# Patient Record
Sex: Female | Born: 1965 | Race: Black or African American | Hispanic: No | Marital: Single | Smoking: Never smoker
Health system: Southern US, Community
[De-identification: ages and names within clinical notes are randomized; demographics above are authoritative.]

## PROBLEM LIST (undated history)

## (undated) DIAGNOSIS — I10 Essential (primary) hypertension: Secondary | ICD-10-CM

## (undated) DIAGNOSIS — R011 Cardiac murmur, unspecified: Secondary | ICD-10-CM

## (undated) HISTORY — DX: Cardiac murmur, unspecified: R01.1

---

## 2015-12-30 ENCOUNTER — Other Ambulatory Visit (HOSPITAL_COMMUNITY)
Admission: RE | Admit: 2015-12-30 | Discharge: 2015-12-30 | Disposition: A | Discharge status: Home / Self Care | Payer: Managed Care, Other (non HMO) | Source: Ambulatory Visit | Attending: Nurse Practitioner | Admitting: Nurse Practitioner

## 2015-12-30 ENCOUNTER — Other Ambulatory Visit: Payer: Self-pay | Admitting: Nurse Practitioner

## 2015-12-30 DIAGNOSIS — Z1151 Encounter for screening for human papillomavirus (HPV): Secondary | ICD-10-CM | POA: Diagnosis present

## 2015-12-30 DIAGNOSIS — Z01419 Encounter for gynecological examination (general) (routine) without abnormal findings: Secondary | ICD-10-CM | POA: Insufficient documentation

## 2016-01-01 LAB — CYTOLOGY - PAP
Diagnosis: NEGATIVE
HPV: NOT DETECTED

## 2016-01-20 ENCOUNTER — Other Ambulatory Visit: Payer: Self-pay | Admitting: Nurse Practitioner

## 2016-02-19 DIAGNOSIS — N950 Postmenopausal bleeding: Secondary | ICD-10-CM | POA: Diagnosis not present

## 2016-02-25 DIAGNOSIS — N950 Postmenopausal bleeding: Secondary | ICD-10-CM | POA: Diagnosis not present

## 2016-02-25 DIAGNOSIS — N840 Polyp of corpus uteri: Secondary | ICD-10-CM | POA: Diagnosis not present

## 2016-04-08 DIAGNOSIS — E7800 Pure hypercholesterolemia, unspecified: Secondary | ICD-10-CM | POA: Diagnosis not present

## 2016-04-08 DIAGNOSIS — R011 Cardiac murmur, unspecified: Secondary | ICD-10-CM | POA: Diagnosis not present

## 2016-04-08 DIAGNOSIS — N840 Polyp of corpus uteri: Secondary | ICD-10-CM | POA: Diagnosis not present

## 2016-04-08 DIAGNOSIS — Z01818 Encounter for other preprocedural examination: Secondary | ICD-10-CM | POA: Diagnosis not present

## 2016-04-13 ENCOUNTER — Telehealth: Payer: Self-pay | Admitting: Cardiology

## 2016-04-13 NOTE — Telephone Encounter (Signed)
04/13/16 [facility] for upcoming appointment with Dr. SwazilandJordan on 04/19/2016. Records given to [facility]  cbr

## 2016-04-17 NOTE — Progress Notes (Signed)
Cardiology Office Note    Date:  04/19/2016   ID:  Theresa Wright, DOB 09/08/1965, MRN [id number]  PCP:  Levon HedgerEBBIE SCHOENHOFF, MD  Cardiologist:  Madonna Flegal SwazilandJordan, MD    History of Present Illness:  Theresa Wright is a 51 y.o. female seen at the request of Dr. Lynnae PrudeShoenhoff for pre operative cardiac evaluation. She is a 51 yo BF originally from [place] who is scheduled for hysteroscopy for treatment of a uterine polyp. She has a history of heart murmur. She was evaluated in [place] in 2015. Echo at that time showed normal LV function. The LA was mildly enlarged. 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 She denies any other cardiac problems. No SOB, chest pain, palpitations. No edema or orthopnea. 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 Works in [employer]. Denies history of HTN, DM, or HLD.   Past Medical History:  Diagnosis Date  . Heart murmur     No past surgical history on file.  Current Medications: No outpatient prescriptions prior to visit.   No facility-administered medications prior to visit.      Allergies:   Patient has no known allergies.   Social History   Social History  . Marital status: Unknown    Spouse name: N/A  . Number of children: 1  . Years of education: N/A   Occupational History  . Banking    Social History Main Topics  . Smoking status: Never Smoker  . Smokeless tobacco: Never Used  . Alcohol use No  . Drug use: No  . Sexual activity: Not Asked   Other Topics Concern  . None   Social History Narrative  . None     Family History:  The patient's family history includes Hypertension in her mother.   ROS:   Please see the history of present illness.    ROS All other systems reviewed and are negative.   PHYSICAL EXAM:   VS:  BP (!) 173/93   Pulse 68   Ht 5\' 7"  (1.702 m)   Wt 282 lb (127.9 kg)   BMI 44.17 kg/m    GEN: Well nourished, well developed, in no acute distress  HEENT: normal  Neck: no JVD, carotid bruits,  or masses Breasts: large Cardiac: RRR; normal S1, S2. Gr 2/6 harsh systolic murmur LSB louder sitting upright.  Respiratory:  clear to auscultation bilaterally, normal work of breathing GI: soft, nontender, nondistended, + BS MS: no deformity or atrophy  Skin: warm and dry, no rash Neuro:  Alert and Oriented x 3, Strength and sensation are intact Psych: euthymic mood, full affect  Wt Readings from Last 3 Encounters:  04/19/16 282 lb (127.9 kg)      Studies/Labs Reviewed:   EKG:  EKG is not ordered today.  The ekg reviewed 04/08/16 demonstrates NSR, normal Ecg. I have personally reviewed and interpreted this study.   Recent Labs: No results found for requested labs within last 8760 hours.   Lipid Panel No results found for: CHOL, TRIG, HDL, CHOLHDL, VLDL, LDLCALC, LDLDIRECT  Additional studies/ records that were reviewed today include:  Labs dated 12/09/15: cholesterol 168, triglycerides 62, HDL 34, LDL 121. CMET, CBC and TSH all normal.   ASSESSMENT:    1. Heart murmur      PLAN:  In order of problems listed above:  1. Patient has a chronic murmur noted since age 51. Echo in 2015 fairly unremarkable. Suspect murmur more related to AV sclerosis.  Based on exam today I see no cardiac reason to delay planned surgery. Cardiac risk is low. Will follow up as needed.    Medication Adjustments/Labs and Tests Ordered: Current medicines are reviewed at length with the patient today.  Concerns regarding medicines are outlined above.  Medication changes, Labs and Tests ordered today are listed in the Patient Instructions below. Patient Instructions  You are cleared for planned surgery  I will see you as needed      Signed, Jidenna Figgs Sw[place], MD  04/19/2016 3:35 PM    [facility] [address] [place], [place], [address] [phone]

## 2016-04-19 ENCOUNTER — Encounter: Payer: Self-pay | Admitting: Cardiology

## 2016-04-19 ENCOUNTER — Ambulatory Visit (INDEPENDENT_AMBULATORY_CARE_PROVIDER_SITE_OTHER): Payer: BLUE CROSS/BLUE SHIELD | Admitting: Cardiology

## 2016-04-19 VITALS — BP 173/93 | HR 68 | Ht 67.0 in | Wt 282.0 lb

## 2016-04-19 DIAGNOSIS — R011 Cardiac murmur, unspecified: Secondary | ICD-10-CM | POA: Diagnosis not present

## 2016-04-19 NOTE — Patient Instructions (Signed)
You are cleared for planned surgery  I will see you as needed

## 2016-04-23 ENCOUNTER — Telehealth: Payer: Self-pay | Admitting: Cardiovascular Disease

## 2016-04-23 NOTE — Telephone Encounter (Signed)
Faxed to Dr Myna HidalgoJennifer Ozan she states that fax was sent to PCP not her she is doing the surgery-verbalized ok for surgery notified faxed also

## 2016-04-23 NOTE — Telephone Encounter (Signed)
New message   Dr. Myna HidalgoJennifer Wright is calling about pt. Pt was seen on March 5th and surgical clearance was supposed to be faxed over to her. She is calling to have surgical clearance faxed to [phone].

## 2016-05-11 ENCOUNTER — Other Ambulatory Visit: Payer: Self-pay | Admitting: Obstetrics & Gynecology

## 2016-05-11 DIAGNOSIS — N950 Postmenopausal bleeding: Secondary | ICD-10-CM | POA: Diagnosis not present

## 2016-05-11 DIAGNOSIS — N840 Polyp of corpus uteri: Secondary | ICD-10-CM | POA: Diagnosis not present

## 2016-06-02 DIAGNOSIS — N950 Postmenopausal bleeding: Secondary | ICD-10-CM | POA: Diagnosis not present

## 2016-06-09 DIAGNOSIS — E559 Vitamin D deficiency, unspecified: Secondary | ICD-10-CM | POA: Diagnosis not present

## 2016-06-09 DIAGNOSIS — R030 Elevated blood-pressure reading, without diagnosis of hypertension: Secondary | ICD-10-CM | POA: Diagnosis not present

## 2016-06-09 DIAGNOSIS — N950 Postmenopausal bleeding: Secondary | ICD-10-CM | POA: Diagnosis not present

## 2016-08-04 DIAGNOSIS — I10 Essential (primary) hypertension: Secondary | ICD-10-CM | POA: Diagnosis not present

## 2016-09-22 ENCOUNTER — Other Ambulatory Visit: Payer: Self-pay | Admitting: Internal Medicine

## 2016-09-22 DIAGNOSIS — H538 Other visual disturbances: Secondary | ICD-10-CM | POA: Diagnosis not present

## 2016-09-22 DIAGNOSIS — R51 Headache: Secondary | ICD-10-CM | POA: Diagnosis not present

## 2016-09-22 DIAGNOSIS — I10 Essential (primary) hypertension: Secondary | ICD-10-CM | POA: Diagnosis not present

## 2016-09-24 ENCOUNTER — Ambulatory Visit
Admission: RE | Admit: 2016-09-24 | Discharge: 2016-09-24 | Disposition: A | Discharge status: Home / Self Care | Payer: BLUE CROSS/BLUE SHIELD | Source: Ambulatory Visit | Attending: Internal Medicine | Admitting: Internal Medicine

## 2016-09-24 DIAGNOSIS — H538 Other visual disturbances: Secondary | ICD-10-CM

## 2016-09-24 DIAGNOSIS — R51 Headache: Secondary | ICD-10-CM | POA: Diagnosis not present

## 2016-09-24 DIAGNOSIS — I10 Essential (primary) hypertension: Secondary | ICD-10-CM

## 2016-09-24 DIAGNOSIS — R42 Dizziness and giddiness: Secondary | ICD-10-CM | POA: Diagnosis not present

## 2016-10-05 DIAGNOSIS — I10 Essential (primary) hypertension: Secondary | ICD-10-CM | POA: Diagnosis not present

## 2016-10-05 DIAGNOSIS — H538 Other visual disturbances: Secondary | ICD-10-CM | POA: Diagnosis not present

## 2016-11-02 ENCOUNTER — Ambulatory Visit
Admission: RE | Admit: 2016-11-02 | Discharge: 2016-11-02 | Disposition: A | Discharge status: Home / Self Care | Payer: BLUE CROSS/BLUE SHIELD | Source: Ambulatory Visit | Attending: Internal Medicine | Admitting: Internal Medicine

## 2016-11-02 ENCOUNTER — Other Ambulatory Visit: Payer: Self-pay | Admitting: Internal Medicine

## 2016-11-02 DIAGNOSIS — I10 Essential (primary) hypertension: Secondary | ICD-10-CM | POA: Diagnosis not present

## 2016-11-02 DIAGNOSIS — R200 Anesthesia of skin: Secondary | ICD-10-CM | POA: Diagnosis not present

## 2016-11-02 DIAGNOSIS — Z23 Encounter for immunization: Secondary | ICD-10-CM | POA: Diagnosis not present

## 2016-11-02 DIAGNOSIS — M48061 Spinal stenosis, lumbar region without neurogenic claudication: Secondary | ICD-10-CM | POA: Diagnosis not present

## 2016-11-23 DIAGNOSIS — I10 Essential (primary) hypertension: Secondary | ICD-10-CM | POA: Diagnosis not present

## 2016-11-23 DIAGNOSIS — G5712 Meralgia paresthetica, left lower limb: Secondary | ICD-10-CM | POA: Diagnosis not present

## 2017-02-03 DIAGNOSIS — I10 Essential (primary) hypertension: Secondary | ICD-10-CM | POA: Diagnosis not present

## 2017-02-03 DIAGNOSIS — G5712 Meralgia paresthetica, left lower limb: Secondary | ICD-10-CM | POA: Diagnosis not present

## 2017-02-03 DIAGNOSIS — E559 Vitamin D deficiency, unspecified: Secondary | ICD-10-CM | POA: Diagnosis not present

## 2017-02-03 DIAGNOSIS — E7800 Pure hypercholesterolemia, unspecified: Secondary | ICD-10-CM | POA: Diagnosis not present

## 2017-03-21 ENCOUNTER — Telehealth: Payer: Self-pay | Admitting: Cardiology

## 2017-03-21 NOTE — Telephone Encounter (Signed)
New message    Pt c/o of Chest Pain: STAT if CP now or developed within 24 hours  1. Are you having CP right now? NO  2. Are you experiencing any other symptoms (ex. SOB, nausea, vomiting, sweating)? NO  3. How long have you been experiencing CP? 2 WEEKS 4. Is your CP continuous or coming and going? COMING AND GOING  5. Have you taken Nitroglycerin? YES ?

## 2017-03-21 NOTE — Telephone Encounter (Signed)
Returned call to patient of Dr. SwazilandJordan - she is scheduled for MD OV on Mar 24, 2017  Patient reports chest pain in center, top left side. She describes the pain as a piercing feeling. The pain lasts a few seconds. The pain is triggered by stress. Denies any associated symptoms. She does not use NTG (despite what is listed per operator). She takes a medication for HTN but does not recall the name. Asked that she bring a list of her medications to her visit this week. Advised her to seek ED eval for CP lasting >5030minutes or that worsens in severity/frequency. She voiced understanding.

## 2017-03-22 NOTE — Progress Notes (Deleted)
Cardiology Office Note    Date:  03/22/2017   ID:  Theresa Wright, DOB 1966-01-06, MRN [id number]  PCP:  Kendrick Ranch, MD  Cardiologist:  Peter Swaziland, MD    History of Present Illness:  Theresa Wright is a 52 y.o. female seen for evaluation of chest pain. She is a 52 yo BF originally from [place] who was seen previously in March 2018 for evaluation of a murmur.  She has a history of heart murmur. She was evaluated in [place] in 2015. Echo at that time showed normal LV function. The LA was mildly enlarged. 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 She denies any other cardiac problems. No SOB, chest pain, palpitations. No edema or orthopnea. 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 Works in Phot[employer]. Denies history of HTN, DM, or HLD.   Past Medical History:  Diagnosis Date  . Heart murmur     *** The histories are not reviewed yet. Please review them in the "History" navigator section and refresh this SmartLink.  Current Medications: Outpatient Medications Prior to Visit  Medication Sig Dispense Refill  . Cholecalciferol (VITAMIN D PO) Take by mouth.     No facility-administered medications prior to visit.      Allergies:   Patient has no known allergies.   Social History   Socioeconomic History  . Marital status: Unknown    Spouse name: Not on file  . Number of children: 1  . Years of education: Not on file  . Highest education level: Not on file  Social Needs  . Financial resource strain: Not on file  . Food insecurity - worry: Not on file  . Food insecurity - inability: Not on file  . Transportation needs - medical: Not on file  . Transportation needs - non-medical: Not on file  Occupational History  . Occupation: Banking  Tobacco Use  . Smoking status: Never Smoker  . Smokeless tobacco: Never Used  Substance and Sexual Activity  . Alcohol use: No  . Drug use: No  . Sexual activity: Not on file  Other Topics Concern  . Not on  file  Social History Narrative  . Not on file     Family History:  The patient's family history includes Hypertension in her mother.   ROS:   Please see the history of present illness.    ROS All other systems reviewed and are negative.   PHYSICAL EXAM:   VS:  There were no vitals taken for this visit.   GEN: Well nourished, well developed, in no acute distress  HEENT: normal  Neck: no JVD, carotid bruits, or masses Breasts: large Cardiac: RRR; normal S1, S2. Gr 2/6 harsh systolic murmur LSB louder sitting upright.  Respiratory:  clear to auscultation bilaterally, normal work of breathing GI: soft, nontender, nondistended, + BS MS: no deformity or atrophy  Skin: warm and dry, no rash Neuro:  Alert and Oriented x 3, Strength and sensation are intact Psych: euthymic mood, full affect  Wt Readings from Last 3 Encounters:  04/19/16 282 lb (127.9 kg)      Studies/Labs Reviewed:   EKG:  EKG is not ordered today.  The ekg reviewed 04/08/16 demonstrates NSR, normal Ecg. I have personally reviewed and interpreted this study.   Recent Labs: No results found for requested labs within last 8760 hours.   Lipid Panel No results found for: CHOL, TRIG, HDL, CHOLHDL, VLDL, LDLCALC, LDLDIRECT  Additional studies/  records that were reviewed today include:  Labs dated 12/09/15: cholesterol 168, triglycerides 62, HDL 34, LDL 121. CMET, CBC and TSH all normal.   ASSESSMENT:    No diagnosis found.   PLAN:  In order of problems listed above:  1. Patient has a chronic murmur noted since age 89. Echo in 2015 fairly unremarkable. Suspect murmur more related to AV sclerosis. Based on exam today I see no cardiac reason to delay planned surgery. Cardiac risk is low. Will follow up as needed.    Medication Adjustments/Labs and Tests Ordered: Current medicines are reviewed at length with the patient today.  Concerns regarding medicines are outlined above.  Medication changes, Labs and Tests  ordered today are listed in the Patient Instructions below. There are no Patient Instructions on file for this visit.   Signed, Peter SwazilandJordan, MD  03/22/2017 12:52 PM    [facility] [address], [place], [place], [id number] [phone]

## 2017-03-24 ENCOUNTER — Ambulatory Visit: Payer: BLUE CROSS/BLUE SHIELD | Admitting: Cardiology

## 2017-04-02 NOTE — Progress Notes (Signed)
Cardiology Office Note   Date:  04/04/2017   ID:  Theresa BeaverMichelle Gilmore, DOB 04/03/1965, MRN [id number]  PCP:  Kendrick RanchSchoenhoff, Deborah D, MD  Cardiologist: Dr. Allyson SabalBerry Chief Complaint  Patient presents with  . Follow-up  . Shortness of Breath     History of Present Illness:  Theresa Wright is a 52 y.o. female who presents for ongoing assessment and management with history of heart murmur.  Echo at that time showed normal LV function.  The left atrium was mildly enlarged.  She had AV sclerosis with mild MR and TR.  Other history includes severe arthritis in both of her knees.  She was last seen by Dr. Allyson SabalBerry on 04/15/2016. his note states that she has had a chronic heart murmur since age of 52.  We will continue to follow; at that time she was cleared for surgery.  She called our office on 03/21/2017 with complaints of chest discomfort described as a "piercing feeling lasting only a few seconds" pain was triggered by stress.  She was advised to go to ED if pain persisted or worsen otherwise she would follow-up with us in the office.  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 She often feels discomfort in her chest when she is feeling stressed or angry. He is also upset that she has gained 20 pounds since being seen in the past. Our office records only showed a 10 pound weight gain.  She's become quite sedentary, especially over the winter months. [place] when the weather was better.   Past Medical History:  Diagnosis Date  . Heart murmur     History reviewed. No pertinent surgical history.   Current Outpatient Medications  Medication Sig Dispense Refill  . amLODipine (NORVASC) 2.5 MG tablet Take 2.5 mg by mouth daily.  2  . Cholecalciferol (VITAMIN D3) 50000 units TABS Take 50,000 mg by mouth daily.  0   No current facility-administered  medications for this visit.     Allergies:   Patient has no known allergies.    Social History:  The patient  reports that  has never smoked. she has never used smokeless tobacco. She reports that she does not drink alcohol or use drugs.   Family History:  The patient's family history includes Hypertension in her mother.    ROS: All other systems are reviewed and negative. Unless otherwise mentioned in H&P    PHYSICAL EXAM: VS:  BP 132/90   Pulse (!) 57   Ht 5\' 8"  (1.727 m)   Wt 292 lb (132.5 kg)   BMI 44.40 kg/m  , BMI Body mass index is 44.4 kg/m. GEN: Well nourished, well developed, in no acute distress Obese HEENT: normal  Neck: no JVD, carotid bruits, or masses Cardiac: RRR; 1/6 systolic murmur no rubs, or gallops,no edema  Respiratory:  clear to auscultation bilaterally, normal work of breathing GI: soft, nontender, nondistended, + BS MS: no deformity or atrophy  Skin: warm and dry, no rash Neuro:  Strength and sensation are intact Psych: euthymic mood, full affect   EKG: Marland Kitchen. The ekg ordered today demonstrates normal sinus rhythm, rate of 57 bpm. No ST-T wave abnormalities. Poor R-wave transition.   Recent Labs: No results found for requested labs within last 8760 hours.    Lipid Panel No results found  for: CHOL, TRIG, HDL, CHOLHDL, VLDL, LDLCALC, LDLDIRECT    Wt Readings from Last 3 Encounters:  04/04/17 292 lb (132.5 kg)  04/19/16 282 lb (127.9 kg)      Other studies Reviewed: Echocardiogram dated 07/16/2013 (transcribed from scanned document) Conclusion: 1. Overall left ventricular systolic function is normal with an estimated EF of 60% to 65% 2. Normal LV size with upper normal wall thickness 3. Mildly dilated left atrium 4. Normal right atrial and ventricular size 5. Minimal aortic valve sclerosis no significant aortic regurgitation 6. Diastolic filling pattern is normal 7. Borderline increased aortic valve Doppler parameters with 2.2 mm/second  maximum velocity. 19 mmHg/12 mmHg peak/mean pressure gradient and 2.0 cm aortic valve area by continuity equation 8. Minimal mitral annular calcification 9. Mild tricuspid regurg 10. There is no evidence of pulmonary hypertension 11. No pericardial effusion 12. No intracardiac masses thrombi or vegetation. ASSESSMENT AND PLAN:  1.  Noncardiac chest pain: Described as a sharp pain substernal left sternal border beside her breath occurring when she is angry, stressed out, or anxious. It goes away after about 5 minutes when she calms down. It is nonradiating. She states that she has not had any further pain for the last 2 weeks.  2. Self-reported stress: The patient is advised to take a walk each day, deep breathing techniques, and consider changing positions at work he is financially feasible to her job is causing her a great of tension and stress. He is given printed instructions on stress reduction  3. Obesity: Patient self reports a 20 pound weight gain, she has gained 10 pounds since we saw her last. She has stopped exercising. I've advised her to begin walking again especially when the weather gets better. She had been walking at a local park and I've encouraged her to go back to doing that. I've also reminded her that this is a group stress reducer and will help to burn more calories and keep her mindful of her weight and eating patterns.   Current medicines are reviewed at length with the patient today.    Labs/ tests ordered today include: none   Bettey Mare. Liborio Nixon, ANP, AACC   04/04/2017 9:02 AM    [facility] [address], [place], [place] [address] Phone: [phone]; Fax: [phone]

## 2017-04-04 ENCOUNTER — Encounter (INDEPENDENT_AMBULATORY_CARE_PROVIDER_SITE_OTHER): Payer: Self-pay

## 2017-04-04 ENCOUNTER — Ambulatory Visit (INDEPENDENT_AMBULATORY_CARE_PROVIDER_SITE_OTHER): Payer: BLUE CROSS/BLUE SHIELD | Admitting: Adult Health

## 2017-04-04 ENCOUNTER — Encounter: Payer: Self-pay | Admitting: Adult Health

## 2017-04-04 VITALS — BP 132/90 | HR 57 | Ht 68.0 in | Wt 292.0 lb

## 2017-04-04 DIAGNOSIS — R079 Chest pain, unspecified: Secondary | ICD-10-CM

## 2017-04-04 DIAGNOSIS — F411 Generalized anxiety disorder: Secondary | ICD-10-CM | POA: Diagnosis not present

## 2017-04-04 DIAGNOSIS — F430 Acute stress reaction: Secondary | ICD-10-CM | POA: Diagnosis not present

## 2017-04-04 NOTE — Patient Instructions (Signed)
Medication Instructions:  NO CHANGES If you need a refill on your cardiac medications before your next appointment, please call your pharmacy.  Labwork: PLEASE HAVE LABWORK AT PCP AND HAVE THEM FAX [place] RESULTS [phone]  Special Instructions: PLEASE FOLLOW STRESS/EXERCISE INSTRUCTIONS BELOW  Follow-Up: Your physician wants you to follow-up in: 12 MONTHS WITH DR [place] You should receive a reminder letter in the mail two months in advance. If you do not receive a letter, please call our office 01-2018 to schedule the 03-2018 follow-up appointment.   Thank you for choosing [facility] at [facility]!!        Stress and Stress Management Stress is a normal reaction to life events. It is what you feel when life demands more than you are used to or more than you can handle. Some stress can be useful. For example, the stress reaction can help you catch the last bus of the day, study for a test, or meet a deadline at work. But stress that occurs too often or for too long can cause problems. It can affect your emotional health and interfere with relationships and normal daily activities. Too much stress can weaken your immune system and increase your risk for physical illness. If you already have a medical problem, stress can make it worse. What are the causes? All sorts of life events may cause stress. An event that causes stress for one person may not be stressful for another person. Major life events commonly cause stress. These may be positive or negative. Examples include losing your job, moving into a new home, getting married, having a baby, or losing a loved one. Less obvious life events may also cause stress, especially if they occur day after day or in combination. Examples include working long hours, driving in traffic, caring for children, being in debt, or being in a difficult relationship. What are the signs or symptoms? Stress may cause emotional symptoms including, the  following:  Anxiety. This is feeling worried, afraid, on edge, overwhelmed, or out of control.  Anger. This is feeling irritated or impatient.  Depression. This is feeling sad, down, helpless, or guilty.  Difficulty focusing, remembering, or making decisions.  Stress may cause physical symptoms, including the following:  Aches and pains. These may affect your head, neck, back, stomach, or other areas of your body.  Tight muscles or clenched jaw.  Low energy or trouble sleeping.  Stress may cause unhealthy behaviors, including the following:  Eating to feel better (overeating) or skipping meals.  Sleeping too little, too much, or both.  Working too much or putting off tasks (procrastination).  Smoking, drinking alcohol, or using drugs to feel better.  How is this diagnosed? Stress is diagnosed through an assessment by your health care provider. Your health care provider will ask questions about your symptoms and any stressful life events.Your health care provider will also ask about your medical history and may order blood tests or other tests. Certain medical conditions and medicine can cause physical symptoms similar to stress. Mental illness can cause emotional symptoms and unhealthy behaviors similar to stress. Your health care provider may refer you to a mental health professional for further evaluation. How is this treated? Stress management is the recommended treatment for stress.The goals of stress management are reducing stressful life events and coping with stress in healthy ways. Techniques for reducing stressful life events include the following:  Stress identification. Self-monitor for stress and identify what causes stress for you. These skills may help you  to avoid some stressful events.  Time management. Set your priorities, keep a calendar of events, and learn to say "no." These tools can help you avoid making too many commitments.  Techniques for coping with  stress include the following:  Rethinking the problem. Try to think realistically about stressful events rather than ignoring them or overreacting. Try to find the positives in a stressful situation rather than focusing on the negatives.  Exercise. Physical exercise can release both physical and emotional tension. The key is to find a form of exercise you enjoy and do it regularly.  Relaxation techniques. These relax the body and mind. Examples include yoga, meditation, tai chi, biofeedback, deep breathing, progressive muscle relaxation, listening to music, being out in nature, journaling, and other hobbies. Again, the key is to find one or more that you enjoy and can do regularly.  Healthy lifestyle. Eat a balanced diet, get plenty of sleep, and do not smoke. Avoid using alcohol or drugs to relax.  Strong support network. Spend time with family, friends, or other people you enjoy being around.Express your feelings and talk things over with someone you trust.  Counseling or talktherapy with a mental health professional may be helpful if you are having difficulty managing stress on your own. Medicine is typically not recommended for the treatment of stress.Talk to your health care provider if you think you need medicine for symptoms of stress. Follow these instructions at home:  Keep all follow-up visits as directed by your health care provider.  Take all medicines as directed by your health care provider. Contact a health care provider if:  Your symptoms get worse or you start having new symptoms.  You feel overwhelmed by your problems and can no longer manage them on your own. Get help right away if:  You feel like hurting yourself or someone else. This information is not intended to replace advice given to you by your health care provider. Make sure you discuss any questions you have with your health care provider. Document Released: 07/28/2000 Document Revised: 07/10/2015 Document  Reviewed: 09/26/2012 Elsevier Interactive Patient Education  2017 [employer].   Exercising to [employer] Exercising regularly is important. It has many health benefits, such as:  Improving your overall fitness, flexibility, and endurance.  Increasing your bone density.  Helping with weight control.  Decreasing your body fat.  Increasing your muscle strength.  Reducing stress and tension.  Improving your overall health.  In order to become healthy and stay healthy, it is recommended that you do moderate-intensity and vigorous-intensity exercise. You can tell that you are exercising at a moderate intensity if you have a higher heart rate and faster breathing, but you are still able to hold a conversation. You can tell that you are exercising at a vigorous intensity if you are breathing much harder and faster and cannot hold a conversation while exercising. How often should I exercise? Choose an activity that you enjoy and set realistic goals. Your health care provider can help you to make an activity plan that works for you. Exercise regularly as directed by your health care provider. This may include:  Doing resistance training twice each week, such as: ? Push-ups. ? Sit-ups. ? Lifting weights. ? Using resistance bands.  Doing a given intensity of exercise for a given amount of time. Choose from these options: ? 150 minutes of moderate-intensity exercise every week. ? 75 minutes of vigorous-intensity exercise every week. ? A mix of moderate-intensity and vigorous-intensity exercise every week.  Children, pregnant women, people who are out of shape, people who are overweight, and older adults may need to consult a health care provider for individual recommendations. If you have any sort of medical condition, be sure to consult your health care provider before starting a new exercise program. What are some exercise ideas? Some moderate-intensity exercise ideas  include:  Walking at a rate of 1 mile in 15 minutes.  Biking.  Hiking.  Golfing.  Dancing.  Some vigorous-intensity exercise ideas include:  Walking at a rate of at least 4.5 miles per hour.  Jogging or running at a rate of 5 miles per hour.  Biking at a rate of at least 10 miles per hour.  Lap swimming.  Roller-skating or in-line skating.  Cross-country skiing.  Vigorous competitive sports, such as football, basketball, and soccer.  Jumping rope.  Aerobic dancing.  What are some everyday activities that can help me to get exercise?  [place] work, such as: ? Pushing a Conservation officer, nature. ? Raking and bagging leaves.  Washing and waxing your car.  Pushing a stroller.  Shoveling snow.  Gardening.  Washing windows or floors. How can I be more active in my day-to-day activities?  Use the stairs instead of the elevator.  Take a walk during your lunch break.  If you drive, park your car farther away from work or school.  If you take public transportation, get off one stop early and walk the rest of the way.  Make all of your phone calls while standing up and walking around.  Get up, stretch, and walk around every 30 minutes throughout the day. What guidelines should I follow while exercising?  Do not exercise so much that you hurt yourself, feel dizzy, or get very short of breath.  Consult your health care provider before starting a new exercise program.  Wear comfortable clothes and shoes with good support.  Drink plenty of water while you exercise to prevent dehydration or heat stroke. Body water is lost during exercise and must be replaced.  Work out until you breathe faster and your heart beats faster. This information is not intended to replace advice given to you by your health care provider. Make sure you discuss any questions you have with your health care provider. Document Released: 03/06/2010 Document Revised: 07/10/2015 Document Reviewed:  07/05/2013 Elsevier Interactive Patient Education  [employer].

## 2017-06-01 DIAGNOSIS — Z9889 Other specified postprocedural states: Secondary | ICD-10-CM | POA: Diagnosis not present

## 2017-06-01 DIAGNOSIS — Z01411 Encounter for gynecological examination (general) (routine) with abnormal findings: Secondary | ICD-10-CM | POA: Diagnosis not present

## 2017-06-01 DIAGNOSIS — N939 Abnormal uterine and vaginal bleeding, unspecified: Secondary | ICD-10-CM | POA: Diagnosis not present

## 2017-06-22 DIAGNOSIS — N939 Abnormal uterine and vaginal bleeding, unspecified: Secondary | ICD-10-CM | POA: Diagnosis not present

## 2017-06-27 ENCOUNTER — Other Ambulatory Visit: Payer: Self-pay

## 2017-10-05 DIAGNOSIS — Z124 Encounter for screening for malignant neoplasm of cervix: Secondary | ICD-10-CM | POA: Diagnosis not present

## 2017-10-05 DIAGNOSIS — N939 Abnormal uterine and vaginal bleeding, unspecified: Secondary | ICD-10-CM | POA: Diagnosis not present

## 2017-10-05 DIAGNOSIS — N926 Irregular menstruation, unspecified: Secondary | ICD-10-CM | POA: Diagnosis not present

## 2017-10-05 DIAGNOSIS — Z6841 Body Mass Index (BMI) 40.0 and over, adult: Secondary | ICD-10-CM | POA: Diagnosis not present

## 2017-11-02 DIAGNOSIS — L02429 Furuncle of limb, unspecified: Secondary | ICD-10-CM | POA: Diagnosis not present

## 2017-11-02 DIAGNOSIS — Z1231 Encounter for screening mammogram for malignant neoplasm of breast: Secondary | ICD-10-CM | POA: Diagnosis not present

## 2017-11-02 DIAGNOSIS — Z09 Encounter for follow-up examination after completed treatment for conditions other than malignant neoplasm: Secondary | ICD-10-CM | POA: Diagnosis not present

## 2018-04-06 DIAGNOSIS — Z0000 Encounter for general adult medical examination without abnormal findings: Secondary | ICD-10-CM | POA: Diagnosis not present

## 2018-08-07 DIAGNOSIS — N939 Abnormal uterine and vaginal bleeding, unspecified: Secondary | ICD-10-CM | POA: Diagnosis not present

## 2018-08-07 DIAGNOSIS — D250 Submucous leiomyoma of uterus: Secondary | ICD-10-CM | POA: Diagnosis not present

## 2018-08-24 ENCOUNTER — Other Ambulatory Visit: Payer: Self-pay | Admitting: Obstetrics & Gynecology

## 2018-08-24 DIAGNOSIS — N939 Abnormal uterine and vaginal bleeding, unspecified: Secondary | ICD-10-CM | POA: Diagnosis not present

## 2018-08-24 DIAGNOSIS — Z124 Encounter for screening for malignant neoplasm of cervix: Secondary | ICD-10-CM | POA: Diagnosis not present

## 2018-09-08 DIAGNOSIS — G5712 Meralgia paresthetica, left lower limb: Secondary | ICD-10-CM | POA: Diagnosis not present

## 2018-09-08 DIAGNOSIS — I10 Essential (primary) hypertension: Secondary | ICD-10-CM | POA: Diagnosis not present

## 2018-09-08 DIAGNOSIS — E7800 Pure hypercholesterolemia, unspecified: Secondary | ICD-10-CM | POA: Diagnosis not present

## 2019-05-02 IMAGING — CT CT HEAD W/O CM
3 of 4 series · 17 of 47 positions shown, 20 images · non-contrast
Comparison: None.

CLINICAL DATA: Blurry vision, vertigo and headaches.

EXAM:
CT HEAD WITHOUT CONTRAST
TECHNIQUE: Contiguous axial images were obtained from the base of the skull
through the vertex without intravenous contrast.

[Series 32: 3d filtered head w/o · axial · non-contrast · 0.49mm/px · z∈[-2,+118]mm · 11 of 30 slices shown, 14 images]
[im 3/30  brain]
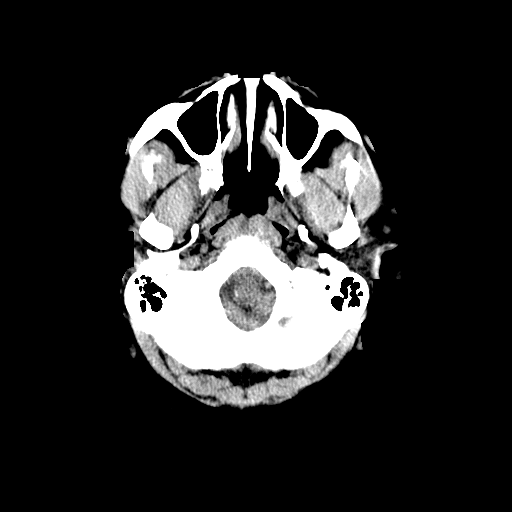
[im 3/30  bone]
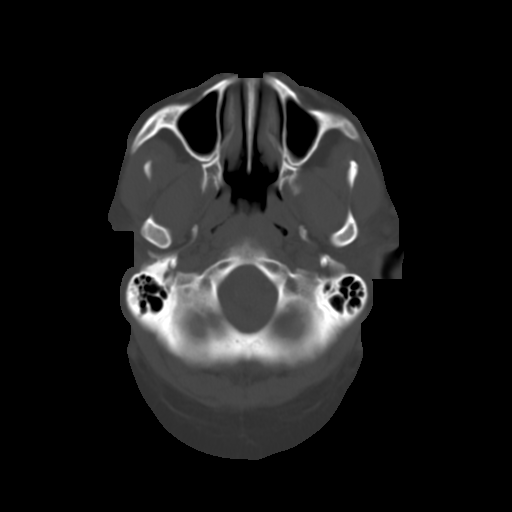
[im 5/30  brain]
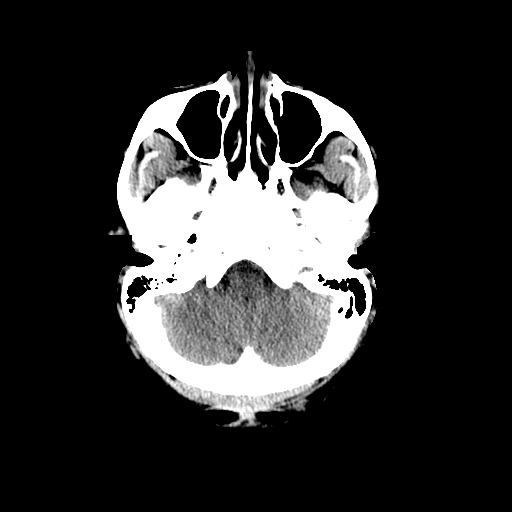
[im 7/30  brain]
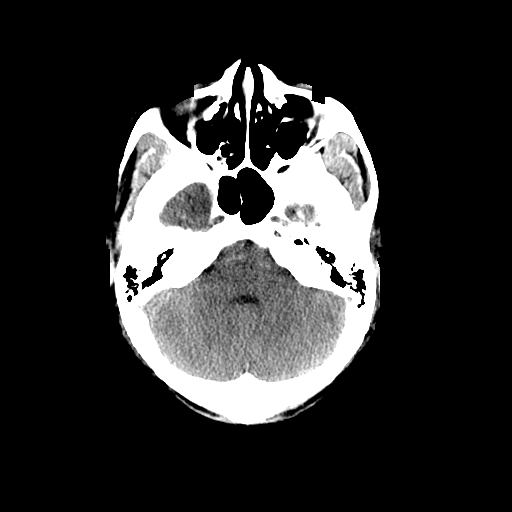
[im 11/30  brain]
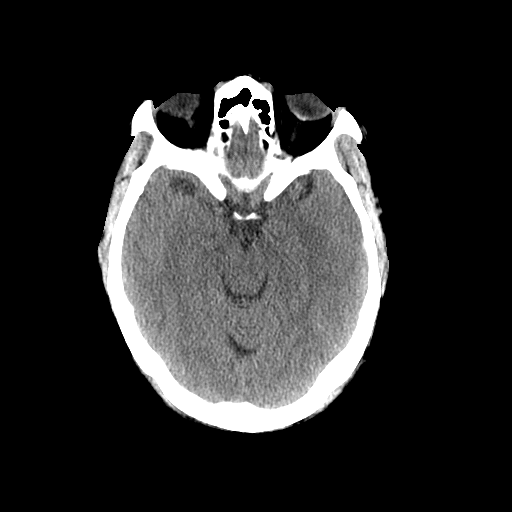
[im 13/30  brain]
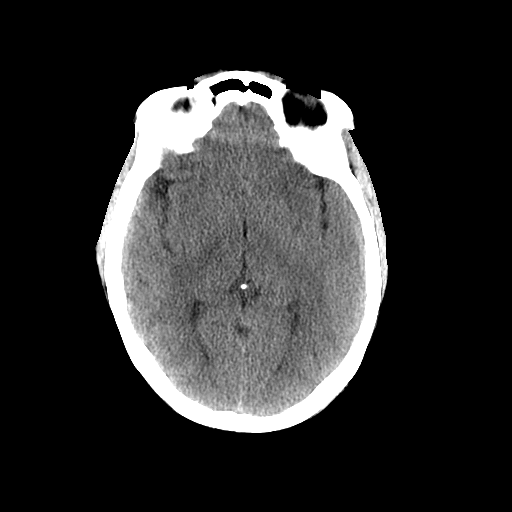
[im 13/30  bone]
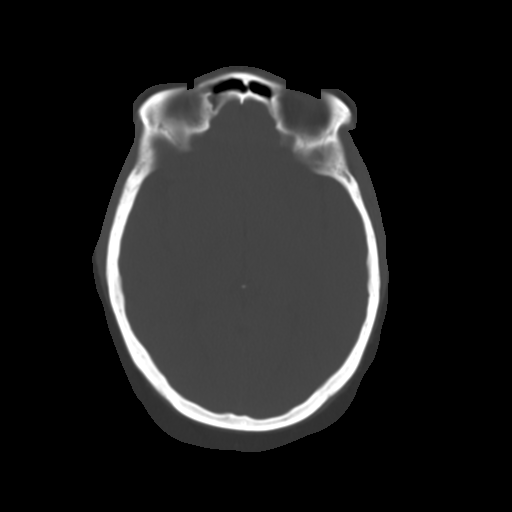
[im 15/30  brain]
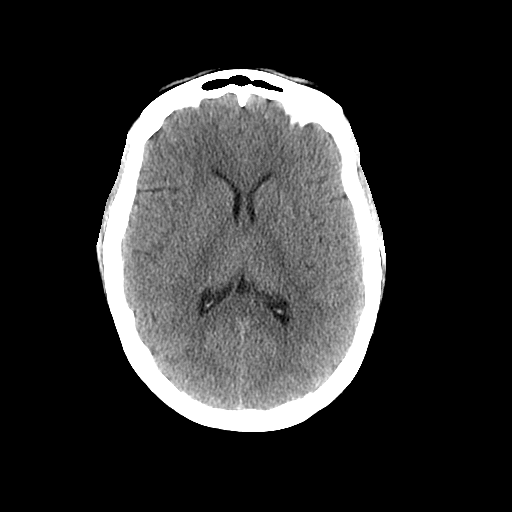
[im 17/30  brain]
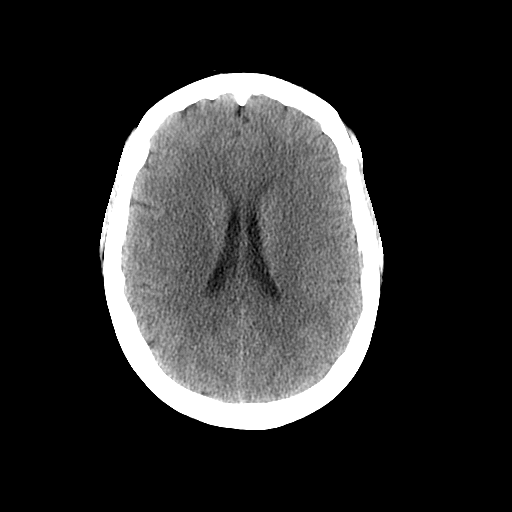
[im 19/30  brain]
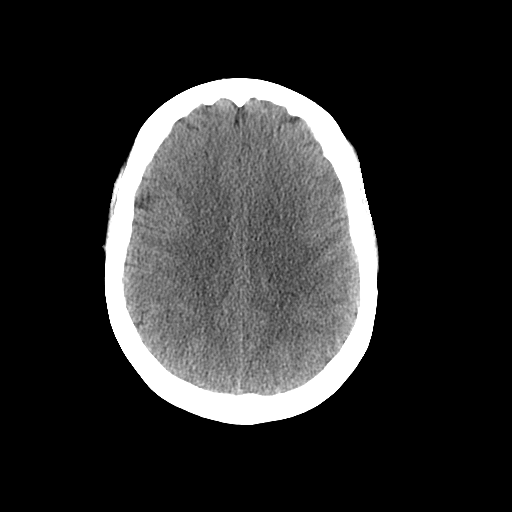
[im 23/30  brain]
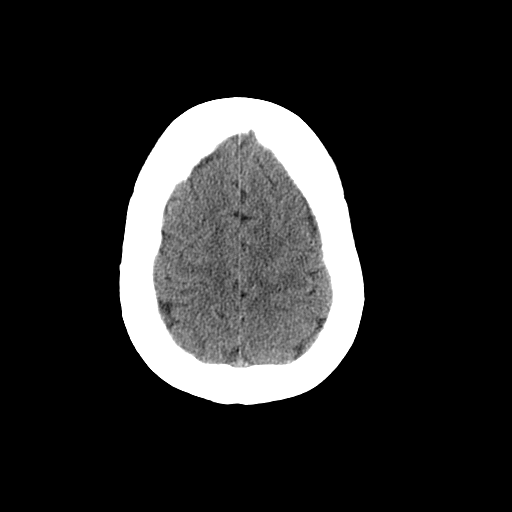
[im 23/30  bone]
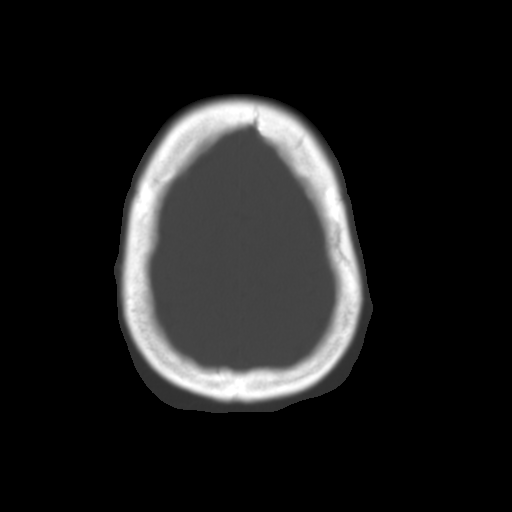
[im 25/30  brain]
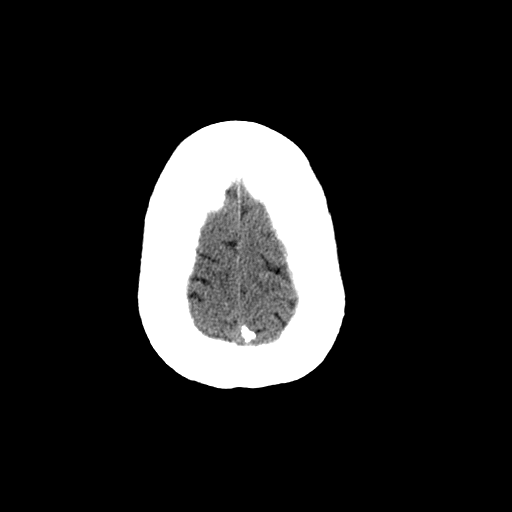
[im 27/30  brain]
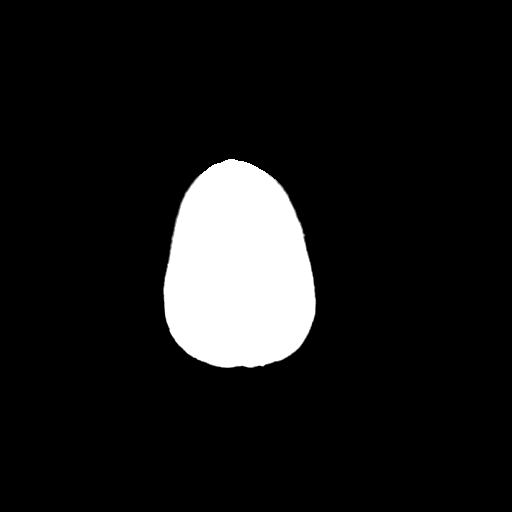

[Series 601: coronal brain · coronal · 0.49mm/px · 3 of 76 slices shown]
[im 26/76  brain]
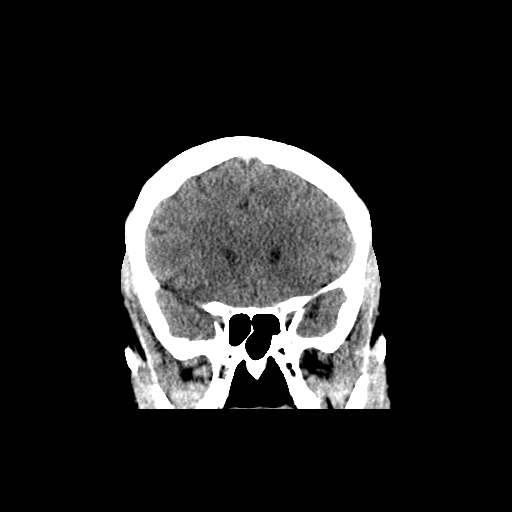
[im 34/76  brain]
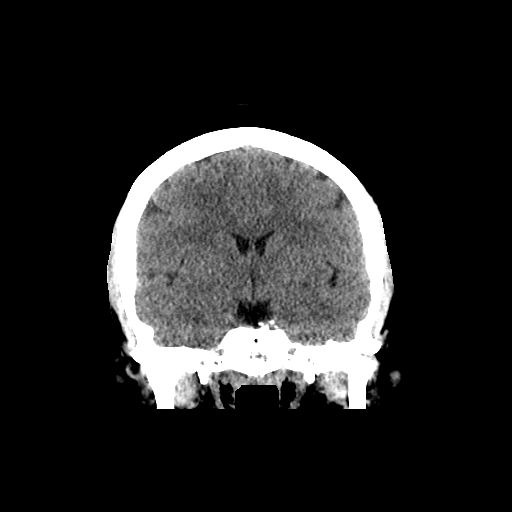
[im 42/76  brain]
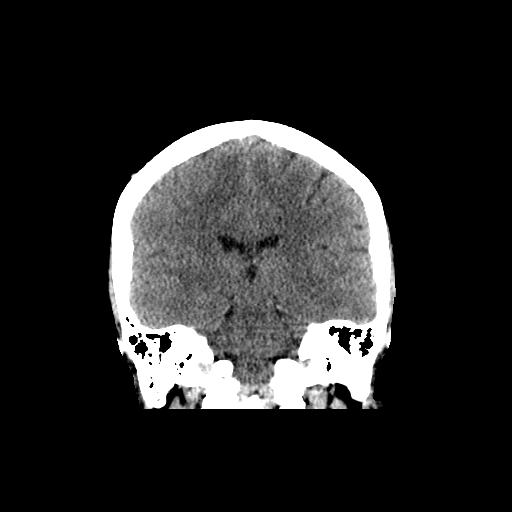

[Series 602: sagittal brain · sagittal · 0.49mm/px · 3 of 60 slices shown]
[im 20/60  brain]
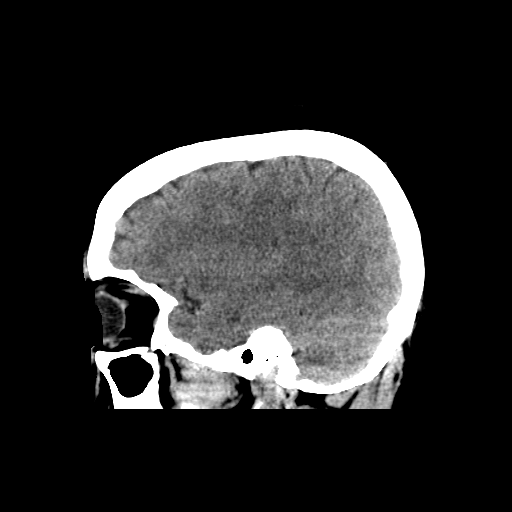
[im 30/60  brain]
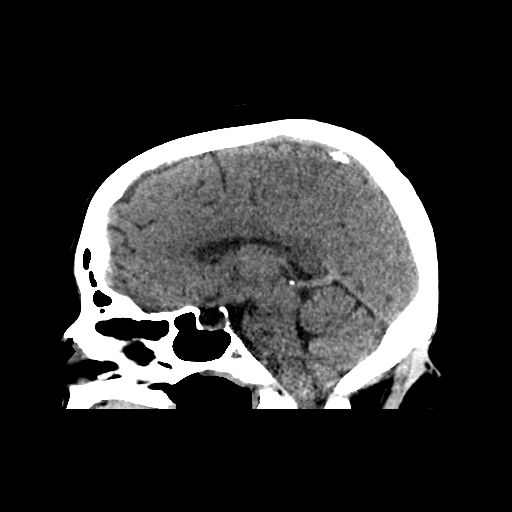
[im 40/60  brain]
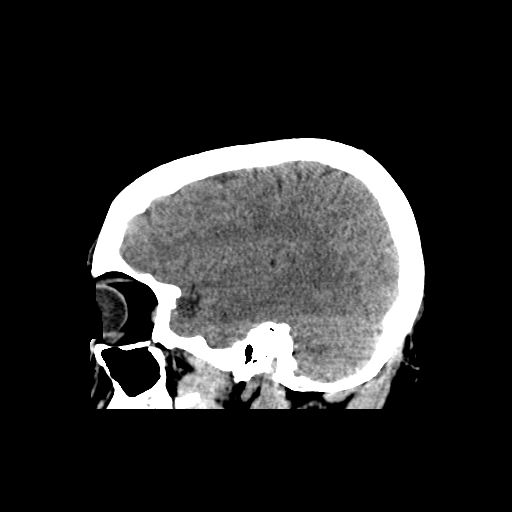

[17 of 47 positions shown; findings below may reference images not displayed]

FINDINGS: Brain: No evidence of acute infarction, hemorrhage, hydrocephalus,
extra-axial collection or mass lesion/mass effect.

Vascular: No hyperdense vessel or unexpected calcification.

Skull: Normal. Negative for fracture or focal lesion.

Sinuses/Orbits: No acute finding.

Other: None.
IMPRESSION: Normal head CT.

## 2019-05-17 DIAGNOSIS — Z713 Dietary counseling and surveillance: Secondary | ICD-10-CM | POA: Diagnosis not present

## 2019-06-10 IMAGING — DX DG LUMBAR SPINE 2-3V
3 series · 3 of 3 positions shown · non-contrast
Comparison: None.

CLINICAL DATA: 51-year-old female with low back pain and left-sided
thigh numbness for 20 years. No injury. Initial encounter.

EXAM:
LUMBAR SPINE - 2-3 VIEW

[dg lumbar spine 2-3 views (1 of 3)]
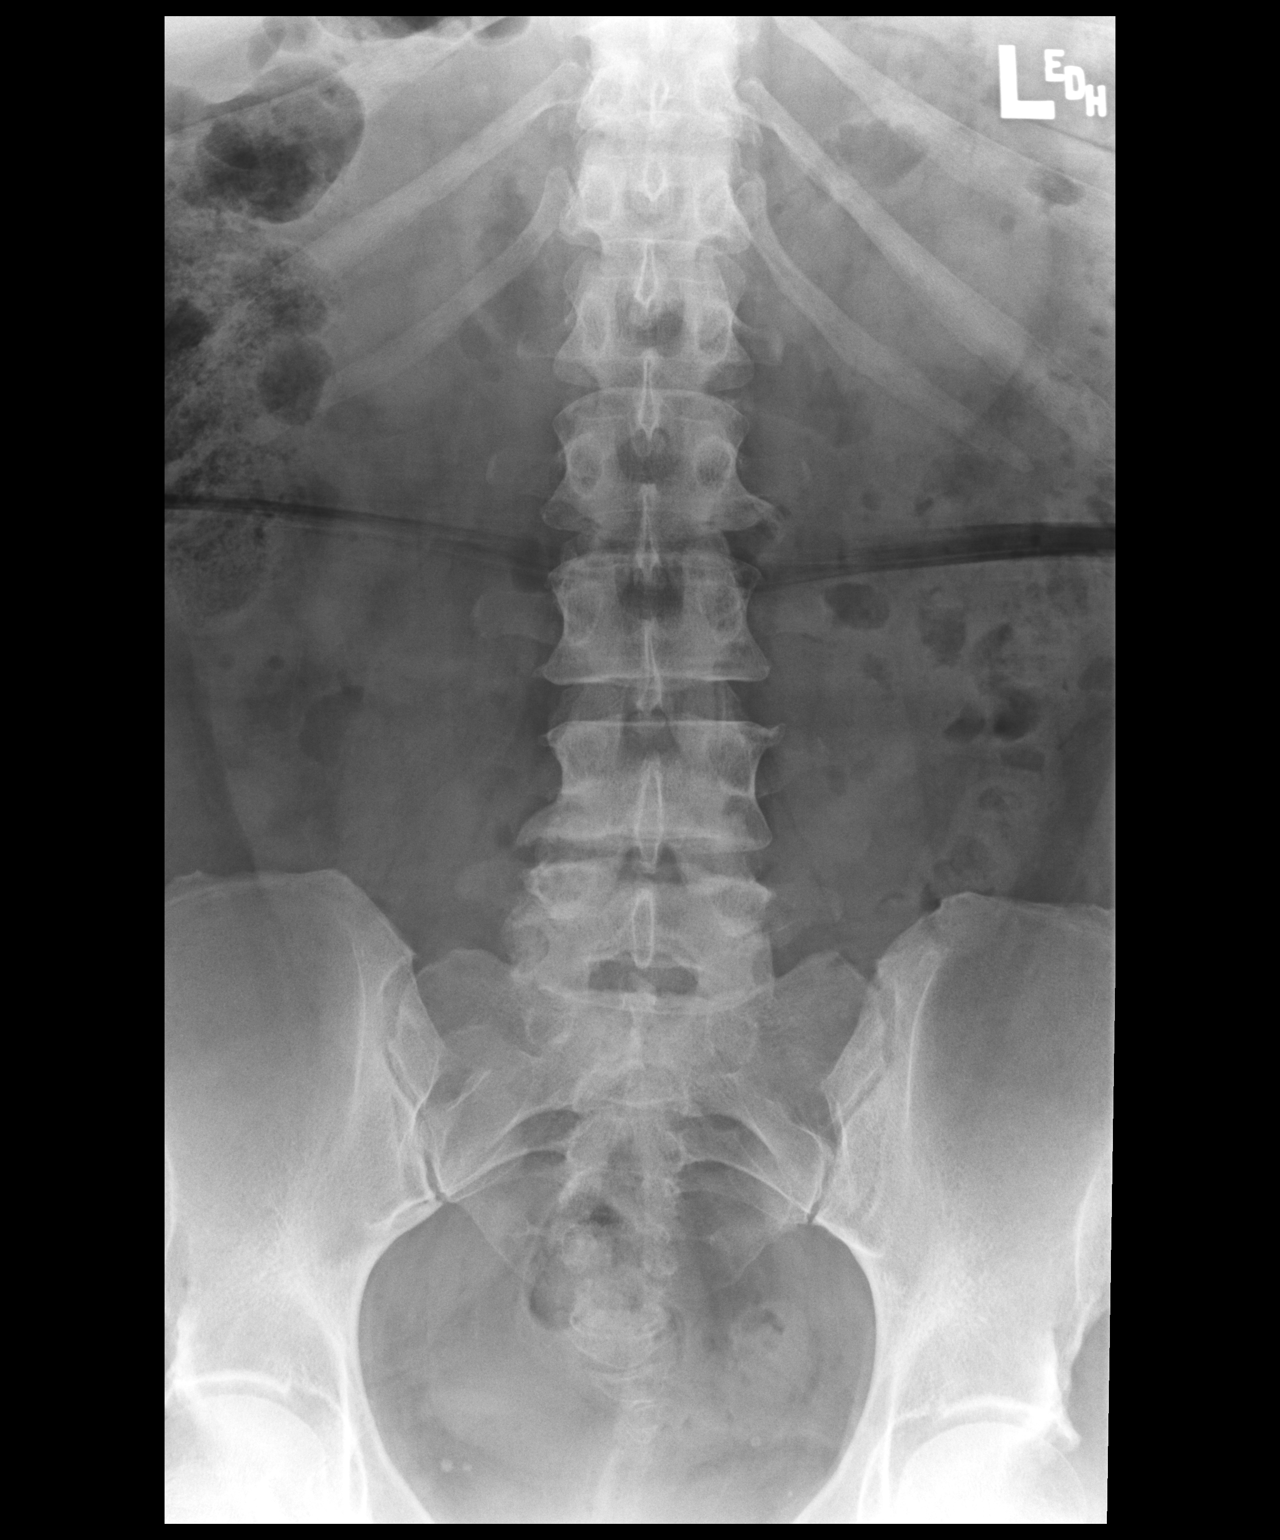

[dg lumbar spine 2-3 views (2 of 3)]
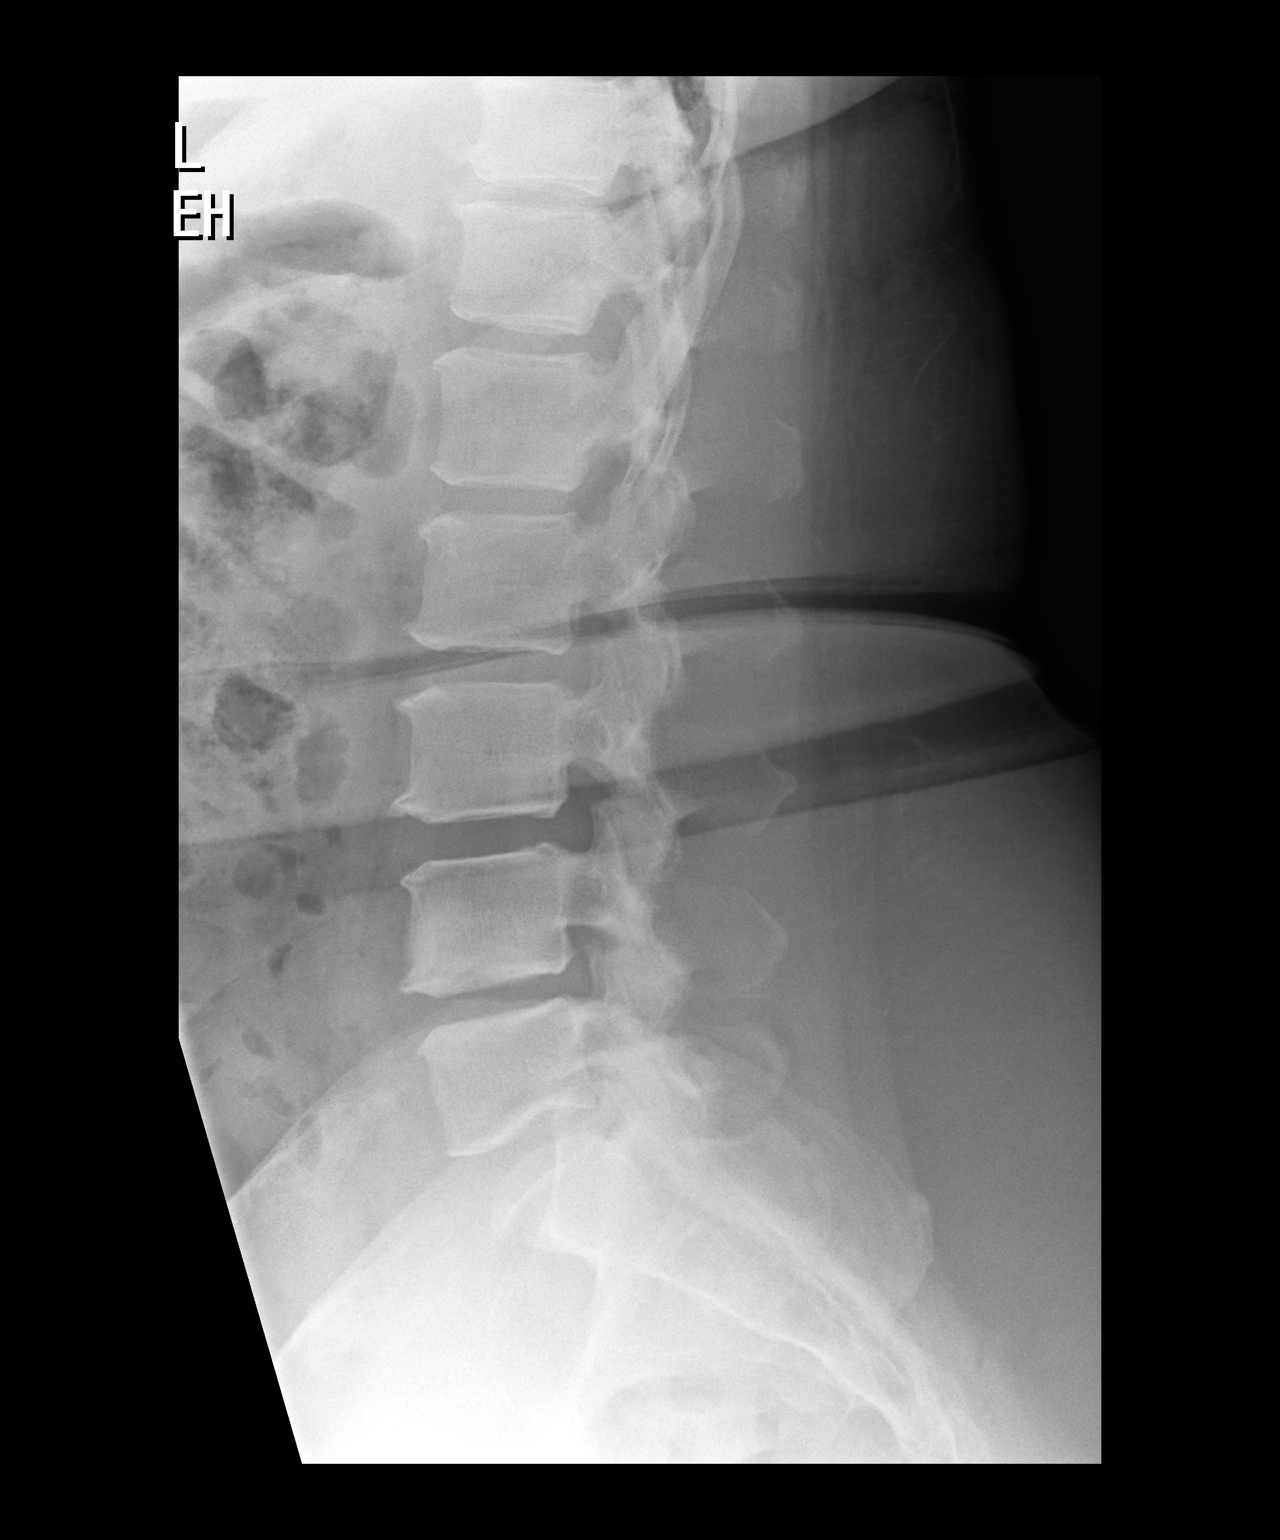

[dg lumbar spine 2-3 views (3 of 3)]
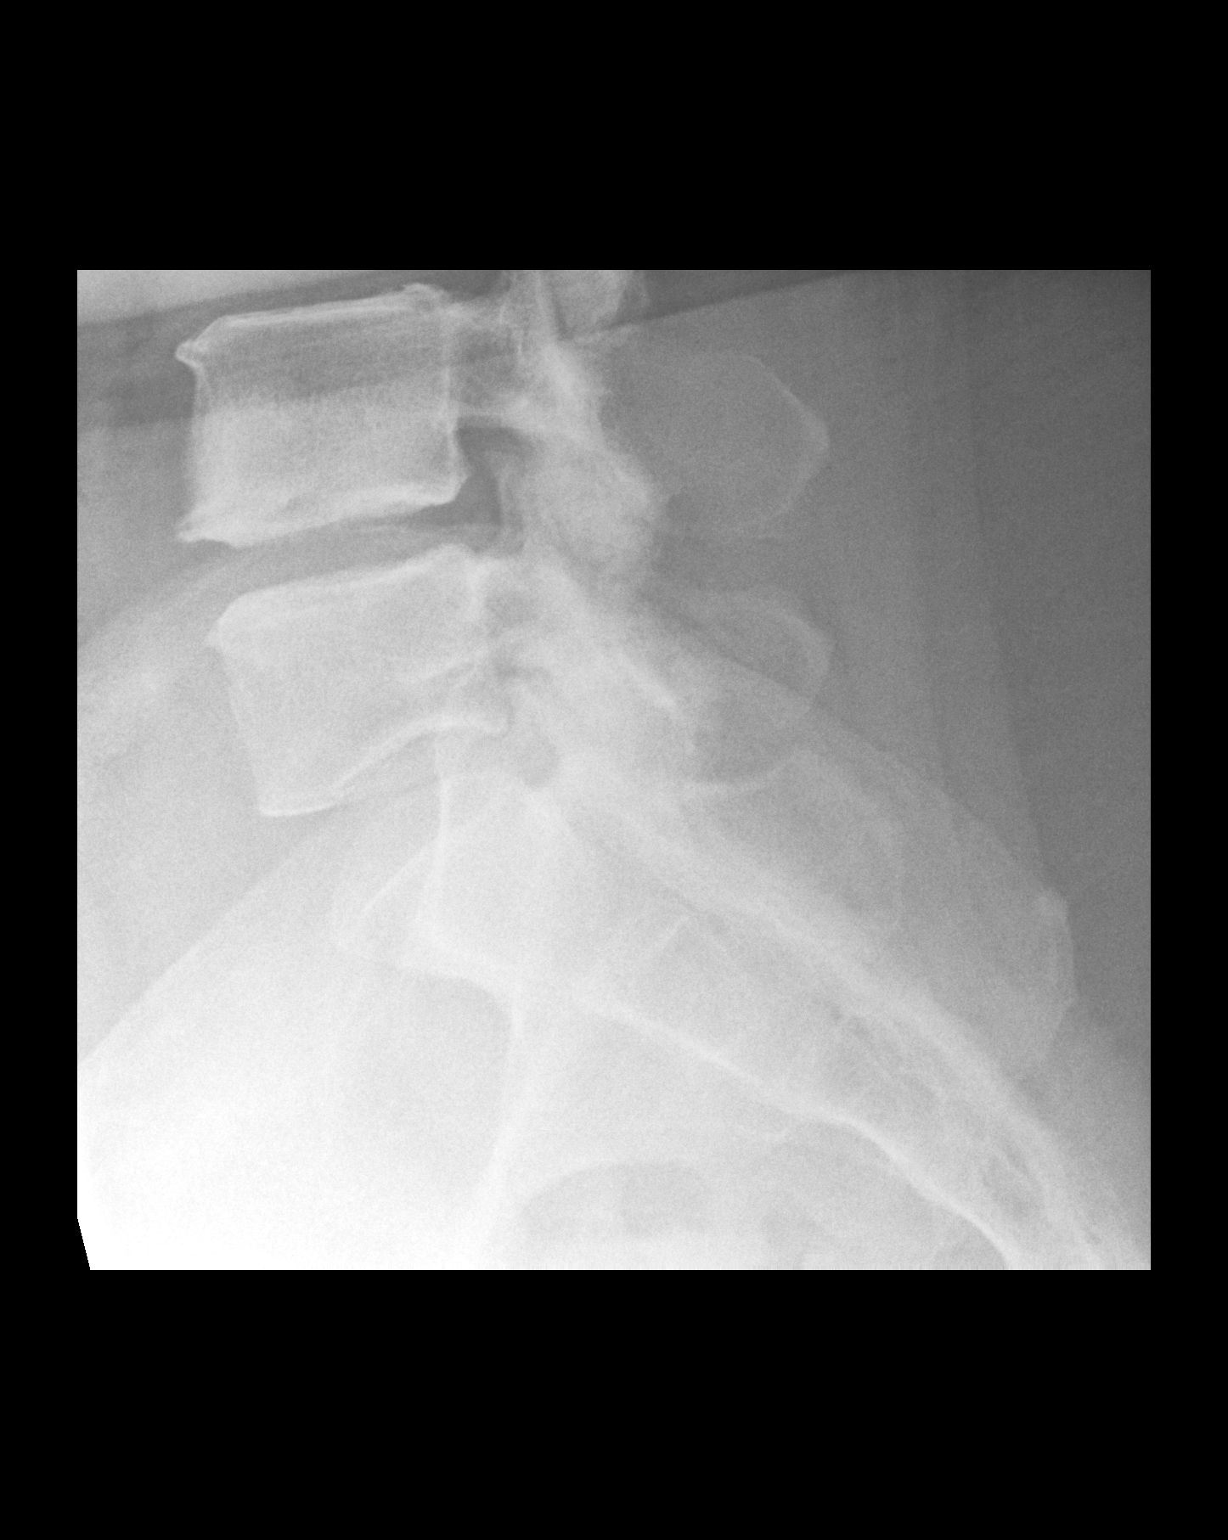

[3 of 3 positions shown; findings below may reference images not displayed]

FINDINGS: Slight straightening lumbar spine.

L4-5 mild disc space narrowing with right lateral osteophyte. Mild
L4 inferior endplate subchondral sclerosis/lucency.

Otherwise no significant disc space narrowing.

Scattered minimal anterior osteophytes throughout the lower thoracic
and lumbar spine.
IMPRESSION: Mild L4-5 disc degeneration as detailed above.

## 2019-06-15 DIAGNOSIS — Z713 Dietary counseling and surveillance: Secondary | ICD-10-CM | POA: Diagnosis not present

## 2019-07-03 ENCOUNTER — Telehealth: Payer: BLUE CROSS/BLUE SHIELD | Admitting: Cardiology

## 2019-07-13 DIAGNOSIS — Z713 Dietary counseling and surveillance: Secondary | ICD-10-CM | POA: Diagnosis not present

## 2019-08-09 DIAGNOSIS — Z713 Dietary counseling and surveillance: Secondary | ICD-10-CM | POA: Diagnosis not present

## 2019-08-09 DIAGNOSIS — Z01419 Encounter for gynecological examination (general) (routine) without abnormal findings: Secondary | ICD-10-CM | POA: Diagnosis not present

## 2019-09-18 DIAGNOSIS — Z13220 Encounter for screening for lipoid disorders: Secondary | ICD-10-CM | POA: Diagnosis not present

## 2019-09-18 DIAGNOSIS — R600 Localized edema: Secondary | ICD-10-CM | POA: Diagnosis not present

## 2019-09-18 DIAGNOSIS — Z0000 Encounter for general adult medical examination without abnormal findings: Secondary | ICD-10-CM | POA: Diagnosis not present

## 2019-10-15 DIAGNOSIS — Z1231 Encounter for screening mammogram for malignant neoplasm of breast: Secondary | ICD-10-CM | POA: Diagnosis not present

## 2020-02-11 DIAGNOSIS — Z1159 Encounter for screening for other viral diseases: Secondary | ICD-10-CM | POA: Diagnosis not present

## 2020-02-13 DIAGNOSIS — K635 Polyp of colon: Secondary | ICD-10-CM | POA: Diagnosis not present

## 2020-02-13 DIAGNOSIS — K5730 Diverticulosis of large intestine without perforation or abscess without bleeding: Secondary | ICD-10-CM | POA: Diagnosis not present

## 2020-02-13 DIAGNOSIS — Z1211 Encounter for screening for malignant neoplasm of colon: Secondary | ICD-10-CM | POA: Diagnosis not present

## 2020-02-13 DIAGNOSIS — K640 First degree hemorrhoids: Secondary | ICD-10-CM | POA: Diagnosis not present

## 2020-10-07 DIAGNOSIS — Z0000 Encounter for general adult medical examination without abnormal findings: Secondary | ICD-10-CM | POA: Diagnosis not present

## 2020-10-07 DIAGNOSIS — I10 Essential (primary) hypertension: Secondary | ICD-10-CM | POA: Diagnosis not present

## 2020-10-07 DIAGNOSIS — E7800 Pure hypercholesterolemia, unspecified: Secondary | ICD-10-CM | POA: Diagnosis not present

## 2020-10-10 DIAGNOSIS — R011 Cardiac murmur, unspecified: Secondary | ICD-10-CM | POA: Diagnosis not present

## 2020-10-21 DIAGNOSIS — Z01419 Encounter for gynecological examination (general) (routine) without abnormal findings: Secondary | ICD-10-CM | POA: Diagnosis not present

## 2020-10-21 DIAGNOSIS — Z1231 Encounter for screening mammogram for malignant neoplasm of breast: Secondary | ICD-10-CM | POA: Diagnosis not present

## 2020-10-30 DIAGNOSIS — R928 Other abnormal and inconclusive findings on diagnostic imaging of breast: Secondary | ICD-10-CM | POA: Diagnosis not present

## 2020-10-30 DIAGNOSIS — N6489 Other specified disorders of breast: Secondary | ICD-10-CM | POA: Diagnosis not present

## 2021-03-23 DIAGNOSIS — R7303 Prediabetes: Secondary | ICD-10-CM | POA: Diagnosis not present

## 2021-04-20 DIAGNOSIS — R7303 Prediabetes: Secondary | ICD-10-CM | POA: Diagnosis not present

## 2021-05-21 DIAGNOSIS — R7303 Prediabetes: Secondary | ICD-10-CM | POA: Diagnosis not present

## 2021-06-20 DIAGNOSIS — R7303 Prediabetes: Secondary | ICD-10-CM | POA: Diagnosis not present

## 2021-07-21 DIAGNOSIS — R7303 Prediabetes: Secondary | ICD-10-CM | POA: Diagnosis not present

## 2021-08-20 DIAGNOSIS — R7303 Prediabetes: Secondary | ICD-10-CM | POA: Diagnosis not present

## 2021-09-20 DIAGNOSIS — R7303 Prediabetes: Secondary | ICD-10-CM | POA: Diagnosis not present

## 2021-10-21 DIAGNOSIS — R7303 Prediabetes: Secondary | ICD-10-CM | POA: Diagnosis not present

## 2021-10-22 DIAGNOSIS — N950 Postmenopausal bleeding: Secondary | ICD-10-CM | POA: Diagnosis not present

## 2021-10-22 DIAGNOSIS — Z01419 Encounter for gynecological examination (general) (routine) without abnormal findings: Secondary | ICD-10-CM | POA: Diagnosis not present

## 2021-10-27 DIAGNOSIS — Z1231 Encounter for screening mammogram for malignant neoplasm of breast: Secondary | ICD-10-CM | POA: Diagnosis not present

## 2021-11-09 DIAGNOSIS — I10 Essential (primary) hypertension: Secondary | ICD-10-CM | POA: Diagnosis not present

## 2021-11-09 DIAGNOSIS — Z13220 Encounter for screening for lipoid disorders: Secondary | ICD-10-CM | POA: Diagnosis not present

## 2021-11-09 DIAGNOSIS — Z0000 Encounter for general adult medical examination without abnormal findings: Secondary | ICD-10-CM | POA: Diagnosis not present

## 2021-11-16 DIAGNOSIS — D259 Leiomyoma of uterus, unspecified: Secondary | ICD-10-CM | POA: Diagnosis not present

## 2021-11-16 DIAGNOSIS — N950 Postmenopausal bleeding: Secondary | ICD-10-CM | POA: Diagnosis not present

## 2021-11-16 DIAGNOSIS — R9389 Abnormal findings on diagnostic imaging of other specified body structures: Secondary | ICD-10-CM | POA: Diagnosis not present

## 2021-11-20 DIAGNOSIS — R7303 Prediabetes: Secondary | ICD-10-CM | POA: Diagnosis not present

## 2021-12-21 DIAGNOSIS — R7303 Prediabetes: Secondary | ICD-10-CM | POA: Diagnosis not present

## 2022-01-20 DIAGNOSIS — R7303 Prediabetes: Secondary | ICD-10-CM | POA: Diagnosis not present

## 2022-02-20 DIAGNOSIS — R7303 Prediabetes: Secondary | ICD-10-CM | POA: Diagnosis not present

## 2022-02-25 DIAGNOSIS — N950 Postmenopausal bleeding: Secondary | ICD-10-CM | POA: Diagnosis not present

## 2022-02-25 DIAGNOSIS — R9389 Abnormal findings on diagnostic imaging of other specified body structures: Secondary | ICD-10-CM | POA: Diagnosis not present

## 2022-02-25 DIAGNOSIS — Z01818 Encounter for other preprocedural examination: Secondary | ICD-10-CM | POA: Diagnosis not present

## 2022-02-25 NOTE — H&P (Signed)
Theresa Wright is an 57 y.o. postmenopausal G1P1 who is admitted for Hysteroscopy with Dilation and Curettage with possible Myosure Polypectomy for PMB and thickened endometrium on ultrasound.  Pt reports on 10/06/2021 she had blood when wiping and then again on 10/12/2021. Pt believes that she has had a full year without a period. Pt reports in 2018 she had polyps noted after Hysteroscopy with Dilation and Curettage by Dr. Nelda Marseille.   Work-up: Pap smear (08/24/2018): NILM/HRHPV negative   TVUS (11/16/2021): Uterus: 8.93 x 6.02 x 5.47 cm Endometrial thickness: 1.13 cm Fibroid 1 3.46 cm  Fibroid 2 2.15 cm  Fibroid 3 1.38 cm  Fibroid 4 2.25 cm Right Ovary 1.75 cm  Left Ovary 2.44 cm Anteverted uterus. Uterine fibroids- fundal 3.5 x 3.1 x 3.1 cm, posterior submucosal 2.2 x 2.1 x 2.0 cm, posterior lower uterine segment submucosal 1.5 x 1.4 x 1.3 cm, anterior submucosal 2.3 x 2.2 x 2.0 cm. All fibroids decreased in size from [facility] 08/07/2018. Endometrium thickened and irregular in appearance due to submucosal fibroids. Posterior lower uterine segment fibroid appears to push into endometrial cavity. Bilateral ovaries WNL. No adnexal masses seen.  Patient Active Problem List   Diagnosis Date Noted   Encounter for screening for other metabolic disorders 50/93/2671   Postmenopausal bleeding 03/02/2022   MEDICAL/FAMILY/SOCIAL HX: No LMP recorded. Patient is postmenopausal.    Past Medical History:  Diagnosis Date   Heart murmur     No past surgical history on file.  Family History  Problem Relation Age of Onset   Hypertension Mother     Social History:  reports that she has never smoked. She has never used smokeless tobacco. She reports that she does not drink alcohol and does not use drugs.  ALLERGIES/MEDS:  Allergies: No Known Allergies  No medications prior to admission.     Review of Systems  Constitutional: Negative.   HENT: Negative.    Eyes: Negative.   Respiratory: Negative.     Cardiovascular: Negative.   Gastrointestinal: Negative.   Genitourinary: Negative.   Musculoskeletal: Negative.   Skin: Negative.   Neurological: Negative.   Endo/Heme/Allergies: Negative.   Psychiatric/Behavioral: Negative.      There were no vitals taken for this visit. Gen:  NAD, pleasant and cooperative Cardio:  RRR Pulm:  CTAB, no wheezes/rales/rhonchi Abd:  Soft, non-distended, non-tender throughout, no rebound/guarding Ext:  No bilateral LE edema, no bilateral calf tenderness Pelvic: Labia - unremarkable, vagina - pink moist mucosa, no lesions or abnormal discharge, cervix - no discharge or lesions or CMT, adnexa - no masses or tenderness - uterus non-tender and normal size on palpation, exam limited by habitus  No results found for this or any previous visit (from the past 24 hour(s)).  No results found.   ASSESSMENT/PLAN: Theresa Wright is a 57 y.o. who is admitted for Hysteroscopy with Dilation and Curettage with possible Myosure Polypectomy for PMB and thickened endometrium on ultrasound.  - Admit to [facility] - Admit labs (CBC, T&S, COVID screen) - Diet:  Per anesthesia/ERAS pathway - IVF:  Per anesthesia - VTE Prophylaxis:  SCDs - Antibiotics: None - D/C home same-day  Consents: I discussed with the patient that this surgery is performed to look inside the uterus and remove the uterine lining.  Prior to surgery, the risks and benefits of the surgery, as well as alternative treatments, have been discussed.  The risks include, but are not limited to bleeding, including the need for a blood transfusion, infection, damage to organs  and tissues, including uterine perforation, requiring additional surgery, postoperative pain, short-term and long-term, failure of the procedure to control symptoms, need for hysterectomy to control bleeding, fluid overload, which could create electrolyte abnormalities and the need to stop the procedure before completion, inability to safely  complete the procedure, deep vein thrombosis and/or pulmonary embolism, painful intercourse, complications the course of which cannot be predicted or prevented, and death.  Patient was consented for blood products.  The patient is aware that bleeding may result in the need for a blood transfusion which includes risk of transmission of HIV (1:2 million), Hepatitis C (1:2 million), and Hepatitis B (1:200 thousand) and transfusion reaction.  Patient voiced understanding of the above risks as well as understanding of indications for blood transfusion.    Drema Dallas, DO

## 2022-03-02 DIAGNOSIS — N950 Postmenopausal bleeding: Secondary | ICD-10-CM | POA: Insufficient documentation

## 2022-03-02 DIAGNOSIS — Z13228 Encounter for screening for other metabolic disorders: Secondary | ICD-10-CM | POA: Insufficient documentation

## 2022-03-11 ENCOUNTER — Encounter (HOSPITAL_BASED_OUTPATIENT_CLINIC_OR_DEPARTMENT_OTHER): Payer: Self-pay | Admitting: Obstetrics and Gynecology

## 2022-03-11 NOTE — Progress Notes (Signed)
Spoke w/ via phone for pre-op interview--- pt Lab needs dos---- I-stat, EKG, CBC, T&S             Lab results------ COVID test -----patient states asymptomatic no test needed Arrive at ------- 1030 NPO after MN NO Solid Food.  Clear liquids from MN until--- 0930 Med rec completed Medications to take morning of surgery ----- Amlodipine Diabetic medication ----- N/A Patient instructed no nail polish to be worn day of surgery Patient instructed to bring photo id and insurance card day of surgery Patient aware to have Driver (ride ) / caregiver    for 24 hours after surgery Theresa Wright  Patient Special Instructions -----  Pre-Op special Istructions ----- Patient verbalized understanding of instructions that were given at this phone interview. Patient denies shortness of breath, chest pain, fever, cough at this phone interview.

## 2022-03-19 ENCOUNTER — Encounter (HOSPITAL_BASED_OUTPATIENT_CLINIC_OR_DEPARTMENT_OTHER): Payer: Self-pay | Admitting: Obstetrics and Gynecology

## 2022-03-19 ENCOUNTER — Ambulatory Visit (HOSPITAL_BASED_OUTPATIENT_CLINIC_OR_DEPARTMENT_OTHER)
Admission: RE | Admit: 2022-03-19 | Discharge: 2022-03-19 | Disposition: A | Discharge status: Home / Self Care | Payer: No Typology Code available for payment source | Attending: Obstetrics and Gynecology | Admitting: Obstetrics and Gynecology

## 2022-03-19 ENCOUNTER — Encounter (HOSPITAL_BASED_OUTPATIENT_CLINIC_OR_DEPARTMENT_OTHER)
Admission: RE | Disposition: A | Discharge status: Home / Self Care | Payer: Self-pay | Source: Home / Self Care | Attending: Obstetrics and Gynecology

## 2022-03-19 ENCOUNTER — Ambulatory Visit (HOSPITAL_BASED_OUTPATIENT_CLINIC_OR_DEPARTMENT_OTHER): Payer: No Typology Code available for payment source | Admitting: Certified Registered Nurse Anesthetist

## 2022-03-19 ENCOUNTER — Other Ambulatory Visit: Payer: Self-pay

## 2022-03-19 DIAGNOSIS — I10 Essential (primary) hypertension: Secondary | ICD-10-CM | POA: Insufficient documentation

## 2022-03-19 DIAGNOSIS — N950 Postmenopausal bleeding: Secondary | ICD-10-CM | POA: Insufficient documentation

## 2022-03-19 DIAGNOSIS — R9389 Abnormal findings on diagnostic imaging of other specified body structures: Secondary | ICD-10-CM

## 2022-03-19 DIAGNOSIS — Z01818 Encounter for other preprocedural examination: Secondary | ICD-10-CM

## 2022-03-19 DIAGNOSIS — D259 Leiomyoma of uterus, unspecified: Secondary | ICD-10-CM | POA: Diagnosis not present

## 2022-03-19 DIAGNOSIS — N840 Polyp of corpus uteri: Secondary | ICD-10-CM | POA: Diagnosis not present

## 2022-03-19 HISTORY — PX: DILATATION & CURETTAGE/HYSTEROSCOPY WITH MYOSURE: SHX6511

## 2022-03-19 HISTORY — DX: Essential (primary) hypertension: I10

## 2022-03-19 LAB — POCT I-STAT, CHEM 8
BUN: 24 mg/dL — ABNORMAL HIGH (ref 6–20)
Calcium, Ion: 1.25 mmol/L (ref 1.15–1.40)
Chloride: 106 mmol/L (ref 98–111)
Creatinine, Ser: 0.8 mg/dL (ref 0.44–1.00)
Glucose, Bld: 98 mg/dL (ref 70–99)
HCT: 40 % (ref 36.0–46.0)
Hemoglobin: 13.6 g/dL (ref 12.0–15.0)
Potassium: 4.7 mmol/L (ref 3.5–5.1)
Sodium: 143 mmol/L (ref 135–145)
TCO2: 28 mmol/L (ref 22–32)

## 2022-03-19 LAB — CBC
HCT: 39.6 % (ref 36.0–46.0)
Hemoglobin: 13 g/dL (ref 12.0–15.0)
MCH: 31.6 pg (ref 26.0–34.0)
MCHC: 32.8 g/dL (ref 30.0–36.0)
MCV: 96.1 fL (ref 80.0–100.0)
Platelets: 185 10*3/uL (ref 150–400)
RBC: 4.12 MIL/uL (ref 3.87–5.11)
RDW: 12.8 % (ref 11.5–15.5)
WBC: 6 10*3/uL (ref 4.0–10.5)
nRBC: 0 % (ref 0.0–0.2)

## 2022-03-19 LAB — TYPE AND SCREEN
ABO/RH(D): O POS
Antibody Screen: NEGATIVE

## 2022-03-19 LAB — ABO/RH: ABO/RH(D): O POS

## 2022-03-19 SURGERY — DILATATION & CURETTAGE/HYSTEROSCOPY WITH MYOSURE
Anesthesia: General | Site: Uterus

## 2022-03-19 MED ORDER — SILVER NITRATE-POT NITRATE 75-25 % EX MISC
CUTANEOUS | Status: DC | PRN
  Administered 2022-03-19: 2

## 2022-03-19 MED ORDER — GLYCOPYRROLATE PF 0.2 MG/ML IJ SOSY
PREFILLED_SYRINGE | INTRAMUSCULAR | Status: AC
  Filled 2022-03-19: qty 1

## 2022-03-19 MED ORDER — KETOROLAC TROMETHAMINE 30 MG/ML IJ SOLN
INTRAMUSCULAR | Status: AC
  Filled 2022-03-19: qty 1

## 2022-03-19 MED ORDER — DEXAMETHASONE SODIUM PHOSPHATE 10 MG/ML IJ SOLN
INTRAMUSCULAR | Status: AC
  Filled 2022-03-19: qty 1

## 2022-03-19 MED ORDER — FENTANYL CITRATE (PF) 100 MCG/2ML IJ SOLN
INTRAMUSCULAR | Status: AC
  Filled 2022-03-19: qty 2

## 2022-03-19 MED ORDER — DEXAMETHASONE SODIUM PHOSPHATE 10 MG/ML IJ SOLN
INTRAMUSCULAR | Status: DC | PRN
  Administered 2022-03-19: 5 mg via INTRAVENOUS

## 2022-03-19 MED ORDER — ONDANSETRON HCL 4 MG/2ML IJ SOLN: 4.0000 mg | Freq: Four times a day (QID) | INTRAMUSCULAR | Status: DC | PRN

## 2022-03-19 MED ORDER — FENTANYL CITRATE (PF) 100 MCG/2ML IJ SOLN
25.0000 ug | INTRAMUSCULAR | Status: DC | PRN
  Administered 2022-03-19: 25 ug via INTRAVENOUS

## 2022-03-19 MED ORDER — PROPOFOL 10 MG/ML IV BOLUS
INTRAVENOUS | Status: AC
  Filled 2022-03-19: qty 20

## 2022-03-19 MED ORDER — KETOROLAC TROMETHAMINE 30 MG/ML IJ SOLN
INTRAMUSCULAR | Status: DC | PRN
  Administered 2022-03-19: 30 mg via INTRAVENOUS

## 2022-03-19 MED ORDER — IBUPROFEN 800 MG PO TABS: 800.0000 mg | ORAL_TABLET | Freq: Three times a day (TID) | ORAL | 0 refills | Status: AC | PRN

## 2022-03-19 MED ORDER — GLYCOPYRROLATE PF 0.2 MG/ML IJ SOSY
PREFILLED_SYRINGE | INTRAMUSCULAR | Status: DC | PRN
  Administered 2022-03-19: .2 mg via INTRAVENOUS

## 2022-03-19 MED ORDER — PROPOFOL 10 MG/ML IV BOLUS
INTRAVENOUS | Status: DC | PRN
  Administered 2022-03-19: 50 mg via INTRAVENOUS
  Administered 2022-03-19: 200 mg via INTRAVENOUS

## 2022-03-19 MED ORDER — FENTANYL CITRATE (PF) 100 MCG/2ML IJ SOLN
INTRAMUSCULAR | Status: DC | PRN
  Administered 2022-03-19 (×2): 50 ug via INTRAVENOUS

## 2022-03-19 MED ORDER — LIDOCAINE 2% (20 MG/ML) 5 ML SYRINGE
INTRAMUSCULAR | Status: DC | PRN
  Administered 2022-03-19: 60 mg via INTRAVENOUS

## 2022-03-19 MED ORDER — OXYCODONE HCL 5 MG PO TABS: 5.0000 mg | ORAL_TABLET | Freq: Once | ORAL | Status: DC | PRN

## 2022-03-19 MED ORDER — ONDANSETRON HCL 4 MG/2ML IJ SOLN
INTRAMUSCULAR | Status: DC | PRN
  Administered 2022-03-19: 4 mg via INTRAVENOUS

## 2022-03-19 MED ORDER — ONDANSETRON HCL 4 MG/2ML IJ SOLN
INTRAMUSCULAR | Status: AC
  Filled 2022-03-19: qty 2

## 2022-03-19 MED ORDER — OXYCODONE HCL 5 MG/5ML PO SOLN: 5.0000 mg | Freq: Once | ORAL | Status: DC | PRN

## 2022-03-19 MED ORDER — LACTATED RINGERS IV SOLN: INTRAVENOUS | Status: DC

## 2022-03-19 MED ORDER — MIDAZOLAM HCL 2 MG/2ML IJ SOLN
INTRAMUSCULAR | Status: AC
  Filled 2022-03-19: qty 2

## 2022-03-19 MED ORDER — PHENYLEPHRINE 80 MCG/ML (10ML) SYRINGE FOR IV PUSH (FOR BLOOD PRESSURE SUPPORT)
PREFILLED_SYRINGE | INTRAVENOUS | Status: DC | PRN
  Administered 2022-03-19: 160 ug via INTRAVENOUS

## 2022-03-19 MED ORDER — MIDAZOLAM HCL 5 MG/5ML IJ SOLN
INTRAMUSCULAR | Status: DC | PRN
  Administered 2022-03-19: 2 mg via INTRAVENOUS

## 2022-03-19 MED ORDER — SODIUM CHLORIDE 0.9 % IR SOLN
Status: DC | PRN
  Administered 2022-03-19: 3000 mL

## 2022-03-19 MED ORDER — PHENYLEPHRINE 80 MCG/ML (10ML) SYRINGE FOR IV PUSH (FOR BLOOD PRESSURE SUPPORT)
PREFILLED_SYRINGE | INTRAVENOUS | Status: AC
  Filled 2022-03-19: qty 10

## 2022-03-19 SURGICAL SUPPLY — 18 items
CATH ROBINSON RED A/P 16FR (CATHETERS) ×1 IMPLANT
COVER SURGICAL LIGHT HANDLE (MISCELLANEOUS) IMPLANT
DEVICE MYOSURE LITE (MISCELLANEOUS) IMPLANT
DEVICE MYOSURE REACH (MISCELLANEOUS) IMPLANT
DILATOR CANAL MILEX (MISCELLANEOUS) IMPLANT
DRSG TELFA 3X8 NADH STRL (GAUZE/BANDAGES/DRESSINGS) ×1 IMPLANT
GAUZE 4X4 16PLY ~~LOC~~+RFID DBL (SPONGE) ×1 IMPLANT
GLOVE BIO SURGEON STRL SZ 6.5 (GLOVE) ×1 IMPLANT
GLOVE BIOGEL M 6.5 STRL (GLOVE) ×1 IMPLANT
GLOVE BIOGEL PI IND STRL 7.0 (GLOVE) ×1 IMPLANT
GOWN STRL REUS W/TWL LRG LVL3 (GOWN DISPOSABLE) ×1 IMPLANT
KIT PROCEDURE FLUENT (KITS) ×1 IMPLANT
KIT TURNOVER CYSTO (KITS) ×1 IMPLANT
PACK VAGINAL MINOR WOMEN LF (CUSTOM PROCEDURE TRAY) ×1 IMPLANT
PAD OB MATERNITY 4.3X12.25 (PERSONAL CARE ITEMS) ×1 IMPLANT
SEAL ROD LENS SCOPE MYOSURE (ABLATOR) ×1 IMPLANT
TOWEL OR 17X26 10 PK STRL BLUE (TOWEL DISPOSABLE) ×2 IMPLANT
UNDERPAD 30X36 HEAVY ABSORB (UNDERPADS AND DIAPERS) ×1 IMPLANT

## 2022-03-19 NOTE — Op Note (Signed)
Pre Op Dx:   1. Postmenopausal bleeding 2. Thickened endometrium on ultrasound (1.13cm) 3. Uterine fibroids  Post Op Dx:   Same as pre-operative diagnoses  Procedure:   Hysteroscopy with Dilation and Curettage with Myosure polypectomy   Surgeon:  Dr. Drema Dallas Assistants:  None Anesthesia:  LMA   EBL:  5cc  IVF:  500cc UOP:  Voided prior to arrival to OR Fluid Deficit:  85cc   Drains:  None Specimen removed:  Endometrial curettings and endometrial polyps -- sent to pathology Device(s) implanted: None Case Type:  Clean-contaminated Findings:  Normal-appearing, atrophic cervix and endocervical canal. Endometrium atrophic appearing with multiple small endometrial polyps. Bilateral tubal ostia visualized. Complications: None Indications:  57 y.o. postmenopausal G1P1 with PMB and thickened endometrium. Description of each procedure:  After informed consent was obtained the patient was taken to the operating room in the dorsal supine position.  After administration of general anesthesia, the patient was placed in the dorsal lithotomy position and prepped and draped in the usual sterile fashion.  Her bladder was emptied using an in and out catheter.  A pre-operative time-out was completed.  The anterior lip of the cervix was grasped with a single-tooth tenaculum and the cervix was serially dilated to accommodate the hysteroscope.  The hysteroscope was advanced and the findings as above was noted.  A sharp banjo curette was used to curettage the endometrium. The single-tooth tenaculum was removed and its sites were made hemostatic with silver nitrate. Adequate hemostasis was noted.  The patient was awakened and extubated and appeared to have tolerated the procedure well.  All counts were correct.  Disposition:  PACU  Drema Dallas, DO

## 2022-03-19 NOTE — Anesthesia Preprocedure Evaluation (Signed)
Anesthesia Evaluation  Patient identified by MRN, date of birth, ID band Patient awake    Reviewed: Allergy & Precautions, H&P , NPO status , Patient's Chart, lab work & pertinent test results  Airway Mallampati: II   Neck ROM: full    Dental   Pulmonary neg pulmonary ROS   breath sounds clear to auscultation       Cardiovascular hypertension,  Rhythm:regular Rate:Normal     Neuro/Psych    GI/Hepatic   Endo/Other    Renal/GU      Musculoskeletal   Abdominal   Peds  Hematology   Anesthesia Other Findings   Reproductive/Obstetrics                             Anesthesia Physical Anesthesia Plan  ASA: 2  Anesthesia Plan: General   Post-op Pain Management:    Induction: Intravenous  PONV Risk Score and Plan: 3 and Ondansetron, Dexamethasone, Midazolam and Treatment may vary due to age or medical condition  Airway Management Planned: LMA  Additional Equipment:   Intra-op Plan:   Post-operative Plan: Extubation in OR  Informed Consent: I have reviewed the patients History and Physical, chart, labs and discussed the procedure including the risks, benefits and alternatives for the proposed anesthesia with the patient or authorized representative who has indicated his/her understanding and acceptance.     Dental advisory given  Plan Discussed with: Anesthesiologist, CRNA and Surgeon  Anesthesia Plan Comments:        Anesthesia Quick Evaluation

## 2022-03-19 NOTE — Interval H&P Note (Signed)
History and Physical Interval Note:  03/19/2022 12:19 PM  Theresa Wright  has presented today for surgery, with the diagnosis of Postmenopausal Bleeding Thickened Endometrium.  The various methods of treatment have been discussed with the patient and family. After consideration of risks, benefits and other options for treatment, the patient has consented to  Procedure(s): Start (N/A) as a surgical intervention.  The patient's history has been reviewed, patient examined, no change in status, stable for surgery.  I have reviewed the patient's chart and labs.  Questions were answered to the patient's satisfaction.     Drema Dallas

## 2022-03-19 NOTE — Anesthesia Procedure Notes (Signed)
Procedure Name: LMA Insertion Date/Time: 03/19/2022 12:37 PM  Performed by: Rogers Blocker, CRNAPre-anesthesia Checklist: Patient identified, Emergency Drugs available, Suction available and Patient being monitored Patient Re-evaluated:Patient Re-evaluated prior to induction Oxygen Delivery Method: Circle System Utilized Preoxygenation: Pre-oxygenation with 100% oxygen Induction Type: IV induction Ventilation: Mask ventilation without difficulty LMA: LMA inserted LMA Size: 4.0 Number of attempts: 1 Airway Equipment and Method: Bite block Placement Confirmation: positive ETCO2 Tube secured with: Tape Dental Injury: Teeth and Oropharynx as per pre-operative assessment

## 2022-03-19 NOTE — Transfer of Care (Signed)
Immediate Anesthesia Transfer of Care Note  Patient: Theresa Wright  Procedure(s) Performed: DILATATION & CURETTAGE/HYSTEROSCOPY WITH MYOSURE (Uterus)  Patient Location: PACU  Anesthesia Type:General  Level of Consciousness: awake, alert , oriented, and patient cooperative  Airway & Oxygen Therapy: Patient Spontanous Breathing and Patient connected to face mask oxygen  Post-op Assessment: Report given to RN and Post -op Vital signs reviewed and stable  Post vital signs: Reviewed and stable  Last Vitals:  Vitals Value Taken Time  BP 131/96 03/19/22 1307  Temp    Pulse    Resp 13 03/19/22 1310  SpO2    Vitals shown include unvalidated device data.  Last Pain:  Vitals:   03/19/22 1118  TempSrc: Oral  PainSc: 0-No pain      Patients Stated Pain Goal: 0 (10/62/69 [id number])  Complications: No notable events documented.

## 2022-03-22 ENCOUNTER — Other Ambulatory Visit: Payer: Self-pay

## 2022-03-22 ENCOUNTER — Encounter (HOSPITAL_BASED_OUTPATIENT_CLINIC_OR_DEPARTMENT_OTHER): Payer: Self-pay | Admitting: Obstetrics and Gynecology

## 2022-03-22 LAB — SURGICAL PATHOLOGY

## 2022-03-22 NOTE — Anesthesia Postprocedure Evaluation (Signed)
Anesthesia Post Note  Patient: Kearra Calkin  Procedure(s) Performed: [place] (Uterus)     Patient location during evaluation: PACU Anesthesia Type: General Level of consciousness: awake and alert Pain management: pain level controlled Vital Signs Assessment: post-procedure vital signs reviewed and stable Respiratory status: spontaneous breathing, nonlabored ventilation, respiratory function stable and patient connected to nasal cannula oxygen Cardiovascular status: blood pressure returned to baseline and stable Postop Assessment: no apparent nausea or vomiting Anesthetic complications: no   No notable events documented.  Last Vitals:  Vitals:   03/19/22 1332 03/19/22 1402  BP: 125/89 (!) 157/76  Pulse: (!) 49 (!) 44  Resp: (!) 9 14  Temp:  36.4 C  SpO2: 100% 100%    Last Pain:  Vitals:   03/19/22 1402  TempSrc:   PainSc: [place]

## 2022-03-23 DIAGNOSIS — R7303 Prediabetes: Secondary | ICD-10-CM | POA: Diagnosis not present

## 2022-04-02 DIAGNOSIS — N950 Postmenopausal bleeding: Secondary | ICD-10-CM | POA: Diagnosis not present

## 2022-04-21 DIAGNOSIS — R7303 Prediabetes: Secondary | ICD-10-CM | POA: Diagnosis not present

## 2022-05-22 DIAGNOSIS — R7303 Prediabetes: Secondary | ICD-10-CM | POA: Diagnosis not present

## 2022-06-21 DIAGNOSIS — R7303 Prediabetes: Secondary | ICD-10-CM | POA: Diagnosis not present

## 2022-07-22 DIAGNOSIS — R7303 Prediabetes: Secondary | ICD-10-CM | POA: Diagnosis not present

## 2022-08-21 DIAGNOSIS — R7303 Prediabetes: Secondary | ICD-10-CM | POA: Diagnosis not present

## 2022-08-26 ENCOUNTER — Other Ambulatory Visit: Payer: Self-pay

## 2022-08-26 ENCOUNTER — Encounter (HOSPITAL_COMMUNITY): Payer: Self-pay | Admitting: Emergency Medicine

## 2022-08-26 ENCOUNTER — Emergency Department (HOSPITAL_COMMUNITY)
Admission: EM | Admit: 2022-08-26 | Discharge: 2022-08-26 | Disposition: A | Discharge status: Home / Self Care | Payer: No Typology Code available for payment source | Attending: Emergency Medicine | Admitting: Emergency Medicine

## 2022-08-26 ENCOUNTER — Emergency Department (HOSPITAL_COMMUNITY): Payer: No Typology Code available for payment source

## 2022-08-26 DIAGNOSIS — R079 Chest pain, unspecified: Secondary | ICD-10-CM | POA: Diagnosis not present

## 2022-08-26 DIAGNOSIS — I10 Essential (primary) hypertension: Secondary | ICD-10-CM | POA: Insufficient documentation

## 2022-08-26 DIAGNOSIS — Z79899 Other long term (current) drug therapy: Secondary | ICD-10-CM | POA: Diagnosis not present

## 2022-08-26 DIAGNOSIS — I160 Hypertensive urgency: Secondary | ICD-10-CM | POA: Insufficient documentation

## 2022-08-26 DIAGNOSIS — R0789 Other chest pain: Secondary | ICD-10-CM | POA: Diagnosis not present

## 2022-08-26 LAB — CBC
HCT: 37.9 % (ref 36.0–46.0)
Hemoglobin: 12.2 g/dL (ref 12.0–15.0)
MCH: 30.7 pg (ref 26.0–34.0)
MCHC: 32.2 g/dL (ref 30.0–36.0)
MCV: 95.2 fL (ref 80.0–100.0)
Platelets: 191 10*3/uL (ref 150–400)
RBC: 3.98 MIL/uL (ref 3.87–5.11)
RDW: 13.3 % (ref 11.5–15.5)
WBC: 5.7 10*3/uL (ref 4.0–10.5)
nRBC: 0 % (ref 0.0–0.2)

## 2022-08-26 LAB — TROPONIN I (HIGH SENSITIVITY)
Troponin I (High Sensitivity): 5 ng/L (ref <18)
Troponin I (High Sensitivity): 5 ng/L (ref <18)

## 2022-08-26 LAB — BASIC METABOLIC PANEL
Anion gap: 6 (ref 5–15)
BUN: 11 mg/dL (ref 6–20)
CO2: 25 mmol/L (ref 22–32)
Calcium: 9.1 mg/dL (ref 8.9–10.3)
Chloride: 108 mmol/L (ref 98–111)
Creatinine, Ser: 0.87 mg/dL (ref 0.44–1.00)
GFR, Estimated: >60 mL/min (ref >60)
Glucose, Bld: 103 mg/dL — ABNORMAL HIGH (ref 70–99)
Potassium: 3.6 mmol/L (ref 3.5–5.1)
Sodium: 139 mmol/L (ref 135–145)

## 2022-08-26 LAB — D-DIMER, QUANTITATIVE: D-Dimer, Quant: 0.47 ug/mL-FEU (ref 0.00–0.50)

## 2022-08-26 MED ORDER — ASPIRIN 81 MG PO CHEW
324.0000 mg | CHEWABLE_TABLET | Freq: Once | ORAL | Status: AC
  Administered 2022-08-26: 324 mg via ORAL
  Filled 2022-08-26: qty 4

## 2022-08-26 NOTE — ED Provider Notes (Signed)
[facility] AT [facility] Provider Note   CSN: [id number] Arrival date & time: 08/26/22  1219     History  Chief Complaint  Patient presents with   Chest Pain    Theresa Wright is a 57 y.o. female.  HPI 56 year old female with a history of hypertension presents with chest pain.  Symptoms started when she woke up and she thinks it woke her up at around 6 AM.  The pain is hard to describe and just feels like a "pain".  It has been unchanging since onset.  If she touches her chest or takes a deep breath it does get worse.  She also notices it while she is walking.  Does not feel short of breath and has not had any radiation of the pain.  No recent leg swelling but she did just get back from a trip to [place] last week.  Home Medications Prior to Admission medications   Medication Sig Start Date End Date Taking? Authorizing Provider  amLODipine (NORVASC) 2.5 MG tablet Take 2.5 mg by mouth daily. 03/21/17  Yes [provider]  Ascorbic Acid (VITAMIN C) 500 MG CAPS Take by mouth.   Yes [provider]  ibuprofen (ADVIL) 800 MG tablet Take 1 tablet (800 mg total) by mouth every 8 (eight) hours as needed. 03/19/22  Yes Steva Ready, DO  vitamin B-12 (CYANOCOBALAMIN) 100 MCG tablet Take 100 mcg by mouth daily.   Yes [provider]  Zinc Sulfate (ZINC 15 PO) Take 50 mg by mouth.   Yes [provider]      Allergies    Patient has no known allergies.    Review of Systems   Review of Systems  Respiratory:  Negative for shortness of breath.   Cardiovascular:  Positive for chest pain. Negative for leg swelling.  Gastrointestinal:  Negative for abdominal pain.  Musculoskeletal:  Negative for back pain.    Physical Exam Updated Vital Signs BP 136/80 (BP Location: Right Arm)   Pulse (!) 50   Temp 98.7 F (37.1 C) (Oral)   Resp 16   Ht 5\' 7"  (1.702 m)   Wt 118 kg   SpO2 100%   BMI 40.74 kg/m  Physical Exam Vitals  and nursing note reviewed.  Constitutional:      Appearance: She is well-developed.  HENT:     Head: Normocephalic and atraumatic.  Cardiovascular:     Rate and Rhythm: Normal rate and regular rhythm.     Heart sounds: Murmur heard.     Comments: Patient reports a known history of a murmur when asked. Pulmonary:     Effort: Pulmonary effort is normal.     Breath sounds: Normal breath sounds.  Abdominal:     Palpations: Abdomen is soft.     Tenderness: There is no abdominal tenderness.  Skin:    General: Skin is warm and dry.  Neurological:     Mental Status: She is alert.     ED Results / Procedures / Treatments   Labs (all labs ordered are listed, but only abnormal results are displayed) Labs Reviewed  BASIC METABOLIC PANEL - Abnormal; Notable for the following components:      Result Value   Glucose, Bld 103 (*)    All other components within normal limits  CBC  D-DIMER, QUANTITATIVE  TROPONIN I (HIGH SENSITIVITY)  TROPONIN I (HIGH SENSITIVITY)    EKG EKG Interpretation Date/Time:  Thursday August 26 2022 15:41:55 EDT Ventricular Rate:  51 PR Interval:  199 QRS Duration:  97 QT Interval:  480 QTC Calculation: 443 R Axis:   22  Text Interpretation: Sinus rhythm Low voltage, precordial leads no acute ST/T changes no significant change since earlier in the day Confirmed by Pricilla Loveless [phone]) on 08/26/2022 3:51:16 PM  Radiology DG Chest 2 View  Result Date: 08/26/2022 CLINICAL DATA:  Central chest pain starting this morning. EXAM: CHEST - 2 VIEW COMPARISON:  None Available. FINDINGS: Cardiac silhouette and mediastinal contours are within normal limits. The lungs are clear. No pleural effusion or pneumothorax. Moderate anterior bridging osteophytes of the midthoracic spine. IMPRESSION: No active cardiopulmonary disease. Electronically Signed   By: Neita Garnet M.D.   On: 08/26/2022 13:43    Procedures Procedures    Medications Ordered in ED Medications   aspirin chewable tablet 324 mg (324 mg Oral Given 08/26/22 1330)    ED Course/ Medical Decision Making/ A&P                             Medical Decision Making Amount and/or Complexity of Data Reviewed Labs: ordered.    Details: Normal troponins x 2 and normal D-dimer. Radiology: ordered and independent interpretation performed.    Details: No CHF ECG/medicine tests: ordered and independent interpretation performed.    Details: No ischemia  Risk OTC drugs.   Patient presents with chest pain.  She does have risk factor of her age and hypertension but her presentations pretty atypical and I think ACS is pretty unlikely.  Troponins negative x 2 with no EKG changes on multiple EKGs.  D-dimer sent given her recent travel history but she is not having shortness of breath, has normal O2 sats, and a normal to low heart rate.  With a negative D-dimer I think PE is unlikely and I do not think further workup is needed.  She has a known heart murmur as I do not think this is pathologic or concerning.  Her blood pressure was quite elevated on arrival though without treatment seem to downtrend.  I did suggest she should follow-up closely with her PCP.  Otherwise, will discharge home with return precautions.        Final Clinical Impression(s) / ED Diagnoses Final diagnoses:  Nonspecific chest pain  Hypertensive urgency    Rx / DC Orders ED Discharge Orders     None         Pricilla Loveless, MD 08/26/22 1614

## 2022-08-26 NOTE — ED Triage Notes (Signed)
Pt reports waking up with central CP, pain does not move. Denies SOB, back pain or n/v. Pt compliant with HTN meds.

## 2022-08-26 NOTE — Discharge Instructions (Signed)
Your workup today does not show any evidence of a heart attack.  This does not mean there is nothing wrong with your heart to you will need to follow-up closely with your primary care physician.  Your blood pressure is also elevated today.  You will need to call your doctor to get reevaluated and to see if there needs to be any adjustment to your medicines.  If you develop recurrent, continued, or worsening chest pain, shortness of breath, fever, vomiting, abdominal or back pain, or any other new/concerning symptoms then return to the ER for evaluation.

## 2022-08-30 DIAGNOSIS — R079 Chest pain, unspecified: Secondary | ICD-10-CM | POA: Diagnosis not present

## 2022-08-30 DIAGNOSIS — I10 Essential (primary) hypertension: Secondary | ICD-10-CM | POA: Diagnosis not present

## 2022-09-21 DIAGNOSIS — R7303 Prediabetes: Secondary | ICD-10-CM | POA: Diagnosis not present

## 2022-10-22 DIAGNOSIS — R7303 Prediabetes: Secondary | ICD-10-CM | POA: Diagnosis not present

## 2022-10-29 DIAGNOSIS — Z01419 Encounter for gynecological examination (general) (routine) without abnormal findings: Secondary | ICD-10-CM | POA: Diagnosis not present

## 2022-10-29 DIAGNOSIS — E559 Vitamin D deficiency, unspecified: Secondary | ICD-10-CM | POA: Diagnosis not present

## 2022-11-02 DIAGNOSIS — Z1231 Encounter for screening mammogram for malignant neoplasm of breast: Secondary | ICD-10-CM | POA: Diagnosis not present

## 2022-11-21 DIAGNOSIS — R7303 Prediabetes: Secondary | ICD-10-CM | POA: Diagnosis not present

## 2022-12-10 DIAGNOSIS — I10 Essential (primary) hypertension: Secondary | ICD-10-CM | POA: Diagnosis not present

## 2022-12-10 DIAGNOSIS — Z0000 Encounter for general adult medical examination without abnormal findings: Secondary | ICD-10-CM | POA: Diagnosis not present

## 2022-12-22 DIAGNOSIS — R7303 Prediabetes: Secondary | ICD-10-CM | POA: Diagnosis not present

## 2023-01-21 DIAGNOSIS — R7303 Prediabetes: Secondary | ICD-10-CM | POA: Diagnosis not present

## 2023-02-21 DIAGNOSIS — R7303 Prediabetes: Secondary | ICD-10-CM | POA: Diagnosis not present

## 2023-03-24 DIAGNOSIS — R7303 Prediabetes: Secondary | ICD-10-CM | POA: Diagnosis not present

## 2023-04-18 DIAGNOSIS — F4320 Adjustment disorder, unspecified: Secondary | ICD-10-CM | POA: Diagnosis not present

## 2023-04-21 DIAGNOSIS — R7303 Prediabetes: Secondary | ICD-10-CM | POA: Diagnosis not present

## 2023-04-25 DIAGNOSIS — F4320 Adjustment disorder, unspecified: Secondary | ICD-10-CM | POA: Diagnosis not present

## 2023-05-02 DIAGNOSIS — F4320 Adjustment disorder, unspecified: Secondary | ICD-10-CM | POA: Diagnosis not present

## 2023-05-09 DIAGNOSIS — F4320 Adjustment disorder, unspecified: Secondary | ICD-10-CM | POA: Diagnosis not present

## 2023-05-16 DIAGNOSIS — F4320 Adjustment disorder, unspecified: Secondary | ICD-10-CM | POA: Diagnosis not present

## 2023-05-22 DIAGNOSIS — R7303 Prediabetes: Secondary | ICD-10-CM | POA: Diagnosis not present

## 2023-05-23 DIAGNOSIS — F4320 Adjustment disorder, unspecified: Secondary | ICD-10-CM | POA: Diagnosis not present

## 2023-05-30 DIAGNOSIS — F4320 Adjustment disorder, unspecified: Secondary | ICD-10-CM | POA: Diagnosis not present

## 2023-06-21 DIAGNOSIS — R7303 Prediabetes: Secondary | ICD-10-CM | POA: Diagnosis not present

## 2023-07-22 DIAGNOSIS — R7303 Prediabetes: Secondary | ICD-10-CM | POA: Diagnosis not present

## 2023-08-21 DIAGNOSIS — R7303 Prediabetes: Secondary | ICD-10-CM | POA: Diagnosis not present

## 2023-09-21 DIAGNOSIS — R7303 Prediabetes: Secondary | ICD-10-CM | POA: Diagnosis not present

## 2023-10-22 DIAGNOSIS — R7303 Prediabetes: Secondary | ICD-10-CM | POA: Diagnosis not present

## 2023-11-21 DIAGNOSIS — R7303 Prediabetes: Secondary | ICD-10-CM | POA: Diagnosis not present

## 2023-12-13 ENCOUNTER — Other Ambulatory Visit (HOSPITAL_COMMUNITY): Payer: Self-pay | Admitting: Internal Medicine

## 2023-12-13 DIAGNOSIS — E559 Vitamin D deficiency, unspecified: Secondary | ICD-10-CM | POA: Diagnosis not present

## 2023-12-13 DIAGNOSIS — I700 Atherosclerosis of aorta: Secondary | ICD-10-CM

## 2023-12-13 DIAGNOSIS — I10 Essential (primary) hypertension: Secondary | ICD-10-CM | POA: Diagnosis not present

## 2023-12-13 DIAGNOSIS — Z0000 Encounter for general adult medical examination without abnormal findings: Secondary | ICD-10-CM | POA: Diagnosis not present

## 2023-12-16 ENCOUNTER — Ambulatory Visit (HOSPITAL_COMMUNITY)
Admission: RE | Admit: 2023-12-16 | Discharge: 2023-12-16 | Disposition: A | Discharge status: Home / Self Care | Source: Ambulatory Visit | Attending: Internal Medicine | Admitting: Internal Medicine

## 2023-12-16 DIAGNOSIS — I700 Atherosclerosis of aorta: Secondary | ICD-10-CM

## 2023-12-16 LAB — ECHOCARDIOGRAM COMPLETE: S' Lateral: 2 cm

## 2023-12-22 DIAGNOSIS — R7303 Prediabetes: Secondary | ICD-10-CM | POA: Diagnosis not present

## 2024-01-18 ENCOUNTER — Encounter: Payer: Self-pay | Admitting: Cardiology

## 2024-01-18 ENCOUNTER — Ambulatory Visit: Attending: Cardiology | Admitting: Cardiology

## 2024-01-18 VITALS — BP 139/82 | HR 60 | Ht 68.0 in | Wt 278.0 lb

## 2024-01-18 DIAGNOSIS — E782 Mixed hyperlipidemia: Secondary | ICD-10-CM | POA: Insufficient documentation

## 2024-01-18 DIAGNOSIS — R931 Abnormal findings on diagnostic imaging of heart and coronary circulation: Secondary | ICD-10-CM | POA: Insufficient documentation

## 2024-01-18 DIAGNOSIS — I10 Essential (primary) hypertension: Secondary | ICD-10-CM | POA: Insufficient documentation

## 2024-01-18 LAB — CBC
Hematocrit: 37.5 % (ref 34.0–46.6)
Hemoglobin: 12 g/dL (ref 11.1–15.9)
MCH: 31 pg (ref 26.6–33.0)
MCHC: 32 g/dL (ref 31.5–35.7)
MCV: 97 fL (ref 79–97)
Platelets: 220 x10E3/uL (ref 150–450)
RBC: 3.87 x10E6/uL (ref 3.77–5.28)
RDW: 13 % (ref 11.7–15.4)
WBC: 7.8 x10E3/uL (ref 3.4–10.8)

## 2024-01-18 NOTE — Progress Notes (Signed)
 Cardiology Office Note:  .   Date:  01/18/2024  ID:  Theresa Wright, DOB [birth year], MRN [id number] PCP: Rexanne Ingle, MD  [facility] Providers Cardiologist:  Newman Lawrence, MD PCP: Rexanne Ingle, MD  Chief Complaint  Patient presents with   Abnormal echocardiogram     Theresa Wright is a 58 y.o. female with hypertension, hyperlipidemia, abnormal echocardiogram  Discussed the use of AI scribe software for clinical note transcription with the patient, who gave verbal consent to proceed.  History of Present Illness Theresa Wright is a 58 year old female with hypertension who presents with a history of chest pain.  Last summer, after returning from a trip, she developed severe chest tightness with radiation to her arm and went to the emergency room. EKG and troponin ruled out myocardial infarction, and she has not had recurrent chest pain since.  Last month she had an echocardiogram for evaluation of that chest pain. She has not had recent chest pain but now feels easily fatigued with exertion and notes dyspnea on physical activity, which she associates with weight gain and aging.  At home her blood pressure is often around 140/80 mmHg or higher despite amlodipine 10 mg daily, which she checks intermittently.  She is inactive due to work and lack of interest in exercise. She notes decreased exercise tolerance, as she previously could jog three miles without difficulty but now becomes winded easily.  She denies recent chest pain or snoring. She continues to feel easily fatigued and short of breath with exertion.      Vitals:   01/18/24 1433  BP: 139/82  Pulse: 60  SpO2: 99%      Review of Systems  Cardiovascular:  Positive for dyspnea on exertion. Negative for chest pain, leg swelling, palpitations and syncope.        Studies Reviewed: SABRA        EKG 01/18/2024: Normal sinus rhythm with sinus arrhythmia Normal ECG When compared with ECG of  26-Aug-2022 15:41, No significant change was found  Echocardiogram 11/2023: 1. Left ventricular ejection fraction, by estimation, is 60 to 65%. The  left ventricle has normal function. The left ventricle has no regional  wall motion abnormalities. There is moderate concentric left ventricular  hypertrophy. Left ventricular  diastolic parameters were normal. The average left ventricular global  longitudinal strain is -21.7 %. The global longitudinal strain is normal.   2. There is a complex density in the RV apex that may represent  trabeculations or prominent moderator band but poorly imaged.. Right  ventricular systolic function is normal. The right ventricular size is  moderately enlarged. Tricuspid regurgitation  signal is inadequate for assessing PA pressure.   3. The mitral valve is normal in structure. No evidence of mitral valve  regurgitation. No evidence of mitral stenosis.   4. The aortic valve is tricuspid. Aortic valve regurgitation is not  visualized. Aortic valve sclerosis/calcification is present, without any  evidence of aortic stenosis.   5. Aortic dilatation noted. There is mild dilatation of the ascending  aorta, measuring 39 mm.   6. The inferior vena cava is normal in size with greater than 50%  respiratory variability, suggesting right atrial pressure of 3 mmHg.   7. There is a complex density in the RV apex that may represent  trabeculations or prominent moderator band but poorly imaged..      Recommend Cardiac MRI for further evaluation.    Labs 11/2023: Chol 186, TG 64, HDL 38, LDL 137 Hb  12.9 Cr 0.8 TSH 2.2    Physical Exam Vitals and nursing note reviewed.  Constitutional:      General: She is not in acute distress.    Appearance: She is obese.  Neck:     Vascular: No JVD.  Cardiovascular:     Rate and Rhythm: Normal rate and regular rhythm.     Heart sounds: Murmur heard.     High-pitched blowing holosystolic murmur is present with a grade  of 2/6 at the apex.  Pulmonary:     Effort: Pulmonary effort is normal.     Breath sounds: Normal breath sounds. No wheezing or rales.  Musculoskeletal:     Right lower leg: No edema.     Left lower leg: No edema.      VISIT DIAGNOSES:   ICD-10-CM   1. Primary hypertension  I10 EKG 12-Lead    CBC    2. Abnormal echocardiogram  R93.1 MR CARDIAC MORPHOLOGY W WO CONTRAST    CBC    3. Mixed hyperlipidemia  E78.2 CT CARDIAC SCORING (SELF PAY ONLY)    Lipid panel    Lipid panel       Theresa Wright is a 58 y.o. female with hypertension, hyperlipidemia, abnormal echocardiogram  Assessment & Plan Abnormal right ventricular finding on echocardiogram: Density noted at the RV apex, inadequately visualized.  Currently resolution of all of this with, but cardiac MRI was also further visualization.    Essential hypertension: Suboptimally controlled on amlodipine 10 mg daily. - Encouraged regular walking to improve stamina and lower blood pressure. - Advised weight loss and low salt diet. - Continue amlodipine 10 mg daily. - If remains >130/80 mmHg, recommend adding additional occasion, maybe chlorthalidone 12.5 mg daily.  Patient is not too keen on adding another medication at this time.  Mixed hyperlipidemia: Elevated LDL.  Discussed diet and lifestyle modification, particularly regarding low intake of red meat and coconut oil. Check calcium score scan for risk stratification. Repeat lipid panel in 3 months.     F/u in 3 months  Signed, Newman JINNY Lawrence, MD

## 2024-01-18 NOTE — Patient Instructions (Signed)
 Lab Work: CBC TODAY  LIPID PANEL IN 3 MONTHS  If you have labs (blood work) drawn today and your tests are completely normal, you will receive your results only by: MyChart Message (if you have MyChart) OR A paper copy in the mail If you have any lab test that is abnormal or we need to change your treatment, we will call you to review the results.  Testing/Procedures: CALCIUM SCORE   CT scanning for a cardiac calcium score (CAT scanning), is a noninvasive, special x-ray that produces cross-sectional images of the body using x-rays and a computer. CT scans help physicians diagnose and treat medical conditions. For some CT exams, a contrast material is used to enhance visibility in the area of the body being studied. CT scans provide greater clarity and reveal more details than regular x-ray exams. Your physician has requested that you have a coronary calcium score performed. This is not covered by insurance and will be an out-of-pocket cost of approximately $99.   CARDIAC MRI     You are scheduled for Cardiac MRI at the location below.  Please arrive for your appointment at ______________ . ?  [facility] [address] [place], [place] [address] Please take advantage of the free valet parking available at the [facility] [employer] (Entrance C).  Proceed to the [facility] Radiology Department (First Floor) for check-in.   OR   [facility] [address] [place], [place] [address] Please go to the [facility] and check-in with the desk attendant.   Magnetic resonance imaging (MRI) is a painless test that produces images of the inside of the body without using Xrays.  During an MRI, strong magnets and radio waves work together in a data processing manager to form detailed images.   MRI images may provide more details about a medical condition than X-rays, CT scans, and ultrasounds can provide.  You may be given earphones to listen for  instructions.  You may eat a light breakfast and take medications as ordered with the exception of furosemide, hydrochlorothiazide, chlorthalidone or spironolactone (or any other fluid pill). If you are undergoing a stress MRI, please avoid stimulants for 12 hr prior to test. (I.e. Caffeine, nicotine, chocolate, or antihistamine medications)  If your provider has ordered anti-anxiety medications for this test, then you will need a driver.  An IV will be inserted into one of your veins. Contrast material will be injected into your IV. It will leave your body through your urine within a day. You may be told to drink plenty of fluids to help flush the contrast material out of your system.  You will be asked to remove all metal, including: Watch, jewelry, and other metal objects including hearing aids, hair pieces and dentures. Also wearable glucose monitoring systems (ie. Freestyle Libre and Omnipods) (Braces and fillings normally are not a problem.)   TEST WILL TAKE APPROXIMATELY 1 HOUR  PLEASE NOTIFY SCHEDULING AT LEAST 24 HOURS IN ADVANCE IF YOU ARE UNABLE TO KEEP YOUR APPOINTMENT. [phone]  For more information and frequently asked questions, please visit our website : [email or web address]  Please call the Cardiac Imaging Nurse Navigators with any questions/concerns. [phone] Office    Follow-Up: At [facility], you and your health needs are our priority.  As part of our continuing mission to provide you with exceptional heart care, our providers are all part of one team.  This team includes your primary Cardiologist (physician) and Advanced Practice Providers or  APPs (Physician Assistants and Nurse Practitioners) who all work together to provide you with the care you need, when you need it.  Your next appointment:   3 month(s)  Provider:   APP or Dr. Elmira

## 2024-01-30 ENCOUNTER — Inpatient Hospital Stay (HOSPITAL_COMMUNITY): Admission: RE | Admit: 2024-01-30 | Discharge: 2024-01-30 | Payer: Self-pay | Attending: Cardiology

## 2024-01-30 ENCOUNTER — Other Ambulatory Visit (HOSPITAL_COMMUNITY)

## 2024-01-30 DIAGNOSIS — E782 Mixed hyperlipidemia: Secondary | ICD-10-CM | POA: Insufficient documentation

## 2024-01-31 ENCOUNTER — Encounter (HOSPITAL_COMMUNITY): Payer: Self-pay

## 2024-02-01 ENCOUNTER — Other Ambulatory Visit: Payer: Self-pay | Admitting: Cardiology

## 2024-02-01 ENCOUNTER — Ambulatory Visit: Payer: Self-pay | Admitting: Cardiology

## 2024-02-01 ENCOUNTER — Ambulatory Visit (HOSPITAL_COMMUNITY)
Admission: RE | Admit: 2024-02-01 | Discharge: 2024-02-01 | Disposition: A | Discharge status: Home / Self Care | Source: Ambulatory Visit | Attending: Cardiology | Admitting: Cardiology

## 2024-02-01 DIAGNOSIS — E782 Mixed hyperlipidemia: Secondary | ICD-10-CM

## 2024-02-01 DIAGNOSIS — R931 Abnormal findings on diagnostic imaging of heart and coronary circulation: Secondary | ICD-10-CM | POA: Diagnosis not present

## 2024-02-01 MED ORDER — GADOBUTROL 1 MMOL/ML IV SOLN
15.0000 mL | Freq: Once | INTRAVENOUS | Status: AC | PRN
  Administered 2024-02-01: 19:00:00 15 mL via INTRAVENOUS

## 2024-02-01 NOTE — Progress Notes (Signed)
 Very small amount of coronary calcification noted.  Consider low-dose statin Crestor 10 mg daily and lipid panel in 3 months..  If not keen on starting statin at this time, recommend heart healthy diet and lifestyle and repeat lipid panel in 3 months.  Thanks MJP

## 2024-02-02 ENCOUNTER — Ambulatory Visit: Payer: Self-pay | Admitting: Cardiology

## 2024-02-02 DIAGNOSIS — R931 Abnormal findings on diagnostic imaging of heart and coronary circulation: Secondary | ICD-10-CM

## 2024-04-19 ENCOUNTER — Ambulatory Visit: Payer: Self-pay | Admitting: Cardiology

## 2025-02-05 ENCOUNTER — Other Ambulatory Visit (HOSPITAL_COMMUNITY)
